# Patient Record
Sex: Female | Born: 1995 | Race: Black or African American | Hispanic: No | Marital: Single | Smoking: Never smoker
Health system: Southern US, Community
[De-identification: ages and names within clinical notes are randomized; demographics above are authoritative.]

## PROBLEM LIST (undated history)

## (undated) DIAGNOSIS — J45909 Unspecified asthma, uncomplicated: Secondary | ICD-10-CM

## (undated) HISTORY — PX: TONSILLECTOMY: SHX5217

---

## 2022-02-25 ENCOUNTER — Encounter: Payer: Self-pay | Admitting: *Deleted

## 2022-02-25 ENCOUNTER — Other Ambulatory Visit: Payer: Self-pay

## 2022-02-25 ENCOUNTER — Emergency Department: Payer: Medicaid Other

## 2022-02-25 ENCOUNTER — Inpatient Hospital Stay
Admission: EM | Admit: 2022-02-25 | Discharge: 2022-02-28 | DRG: 832 | Disposition: A | Discharge status: Home / Self Care | Payer: Medicaid Other | Attending: Internal Medicine | Admitting: Internal Medicine

## 2022-02-25 ENCOUNTER — Observation Stay
Admission: EM | Admit: 2022-02-25 | Discharge: 2022-02-25 | Disposition: A | Discharge status: Home / Self Care | Payer: Medicaid Other | Attending: Internal Medicine | Admitting: Internal Medicine

## 2022-02-25 DIAGNOSIS — K920 Hematemesis: Secondary | ICD-10-CM | POA: Diagnosis present

## 2022-02-25 DIAGNOSIS — O26891 Other specified pregnancy related conditions, first trimester: Secondary | ICD-10-CM | POA: Diagnosis present

## 2022-02-25 DIAGNOSIS — Z3A01 Less than 8 weeks gestation of pregnancy: Secondary | ICD-10-CM | POA: Insufficient documentation

## 2022-02-25 DIAGNOSIS — O210 Mild hyperemesis gravidarum: Principal | ICD-10-CM | POA: Diagnosis present

## 2022-02-25 DIAGNOSIS — J45909 Unspecified asthma, uncomplicated: Secondary | ICD-10-CM | POA: Diagnosis present

## 2022-02-25 DIAGNOSIS — E876 Hypokalemia: Secondary | ICD-10-CM | POA: Diagnosis not present

## 2022-02-25 DIAGNOSIS — O211 Hyperemesis gravidarum with metabolic disturbance: Secondary | ICD-10-CM | POA: Diagnosis present

## 2022-02-25 DIAGNOSIS — O3482 Maternal care for other abnormalities of pelvic organs, second trimester: Secondary | ICD-10-CM | POA: Diagnosis present

## 2022-02-25 DIAGNOSIS — O99511 Diseases of the respiratory system complicating pregnancy, first trimester: Secondary | ICD-10-CM | POA: Diagnosis not present

## 2022-02-25 DIAGNOSIS — O0281 Inappropriate change in quantitative human chorionic gonadotropin (hCG) in early pregnancy: Secondary | ICD-10-CM | POA: Diagnosis not present

## 2022-02-25 DIAGNOSIS — O99321 Drug use complicating pregnancy, first trimester: Secondary | ICD-10-CM | POA: Diagnosis present

## 2022-02-25 DIAGNOSIS — O99611 Diseases of the digestive system complicating pregnancy, first trimester: Secondary | ICD-10-CM | POA: Diagnosis not present

## 2022-02-25 DIAGNOSIS — K760 Fatty (change of) liver, not elsewhere classified: Secondary | ICD-10-CM | POA: Diagnosis present

## 2022-02-25 DIAGNOSIS — K922 Gastrointestinal hemorrhage, unspecified: Secondary | ICD-10-CM | POA: Diagnosis not present

## 2022-02-25 DIAGNOSIS — E039 Hypothyroidism, unspecified: Secondary | ICD-10-CM | POA: Insufficient documentation

## 2022-02-25 DIAGNOSIS — R112 Nausea with vomiting, unspecified: Secondary | ICD-10-CM

## 2022-02-25 DIAGNOSIS — N83201 Unspecified ovarian cyst, right side: Secondary | ICD-10-CM | POA: Diagnosis present

## 2022-02-25 DIAGNOSIS — O99281 Endocrine, nutritional and metabolic diseases complicating pregnancy, first trimester: Secondary | ICD-10-CM | POA: Diagnosis not present

## 2022-02-25 DIAGNOSIS — F1210 Cannabis abuse, uncomplicated: Secondary | ICD-10-CM | POA: Diagnosis present

## 2022-02-25 DIAGNOSIS — R1031 Right lower quadrant pain: Secondary | ICD-10-CM | POA: Diagnosis present

## 2022-02-25 HISTORY — DX: Unspecified asthma, uncomplicated: J45.909

## 2022-02-25 LAB — HEMOGLOBIN AND HEMATOCRIT, BLOOD
HCT: 31.9 % — ABNORMAL LOW (ref 36.0–46.0)
Hemoglobin: 10.9 g/dL — ABNORMAL LOW (ref 12.0–15.0)

## 2022-02-25 LAB — CBC WITH DIFFERENTIAL/PLATELET
Abs Immature Granulocytes: 0.04 10*3/uL (ref 0.00–0.07)
Basophils Absolute: 0 10*3/uL (ref 0.0–0.1)
Basophils Relative: 0 %
Eosinophils Absolute: 0 10*3/uL (ref 0.0–0.5)
Eosinophils Relative: 0 %
HCT: 31.1 % — ABNORMAL LOW (ref 36.0–46.0)
Hemoglobin: 10.7 g/dL — ABNORMAL LOW (ref 12.0–15.0)
Immature Granulocytes: 0 %
Lymphocytes Relative: 19 %
Lymphs Abs: 2.1 10*3/uL (ref 0.7–4.0)
MCH: 30.7 pg (ref 26.0–34.0)
MCHC: 34.4 g/dL (ref 30.0–36.0)
MCV: 89.4 fL (ref 80.0–100.0)
Monocytes Absolute: 0.9 10*3/uL (ref 0.1–1.0)
Monocytes Relative: 8 %
Neutro Abs: 8 10*3/uL — ABNORMAL HIGH (ref 1.7–7.7)
Neutrophils Relative %: 73 %
Platelets: 251 10*3/uL (ref 150–400)
RBC: 3.48 MIL/uL — ABNORMAL LOW (ref 3.87–5.11)
RDW: 12 % (ref 11.5–15.5)
WBC: 11.1 10*3/uL — ABNORMAL HIGH (ref 4.0–10.5)
nRBC: 0 % (ref 0.0–0.2)

## 2022-02-25 LAB — COMPREHENSIVE METABOLIC PANEL
ALT: 62 U/L — ABNORMAL HIGH (ref 0–44)
AST: 65 U/L — ABNORMAL HIGH (ref 15–41)
Albumin: 4.4 g/dL (ref 3.5–5.0)
Alkaline Phosphatase: 34 U/L — ABNORMAL LOW (ref 38–126)
Anion gap: 8 (ref 5–15)
BUN: 9 mg/dL (ref 6–20)
CO2: 24 mmol/L (ref 22–32)
Calcium: 9.1 mg/dL (ref 8.9–10.3)
Chloride: 104 mmol/L (ref 98–111)
Creatinine, Ser: 0.54 mg/dL (ref 0.44–1.00)
GFR, Estimated: >60 mL/min (ref >60)
Glucose, Bld: 98 mg/dL (ref 70–99)
Potassium: 3.2 mmol/L — ABNORMAL LOW (ref 3.5–5.1)
Sodium: 136 mmol/L (ref 135–145)
Total Bilirubin: 1.1 mg/dL (ref 0.3–1.2)
Total Protein: 7.1 g/dL (ref 6.5–8.1)

## 2022-02-25 LAB — CBC
HCT: 35.2 % — ABNORMAL LOW (ref 36.0–46.0)
Hemoglobin: 12 g/dL (ref 12.0–15.0)
MCH: 30.8 pg (ref 26.0–34.0)
MCHC: 34.1 g/dL (ref 30.0–36.0)
MCV: 90.3 fL (ref 80.0–100.0)
Platelets: 298 10*3/uL (ref 150–400)
RBC: 3.9 MIL/uL (ref 3.87–5.11)
RDW: 11.9 % (ref 11.5–15.5)
WBC: 12.1 10*3/uL — ABNORMAL HIGH (ref 4.0–10.5)
nRBC: 0 % (ref 0.0–0.2)

## 2022-02-25 LAB — LIPASE, BLOOD: Lipase: 26 U/L (ref 11–51)

## 2022-02-25 LAB — HIV ANTIBODY (ROUTINE TESTING W REFLEX): HIV Screen 4th Generation wRfx: NONREACTIVE

## 2022-02-25 LAB — HCG, QUANTITATIVE, PREGNANCY: hCG, Beta Chain, Quant, S: 88612 m[IU]/mL — ABNORMAL HIGH (ref <5)

## 2022-02-25 IMAGING — US US ABDOMEN LIMITED
1 series · 14 of 25 positions shown · non-contrast
Comparison: None.

CLINICAL DATA: Hepatic transaminitis.

EXAM:
ULTRASOUND ABDOMEN LIMITED RIGHT UPPER QUADRANT

[Series 1: us abdomen limited ruq (liver/gb) · 14 of 39 slices shown]
[im 1/39]
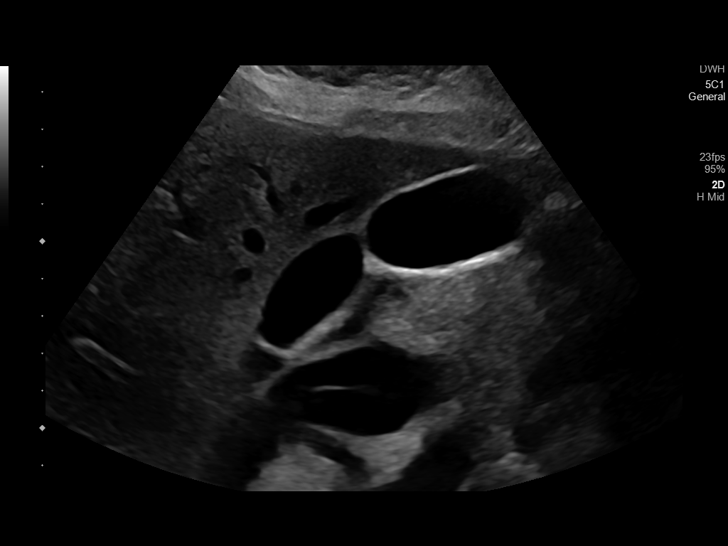
[im 4/39]
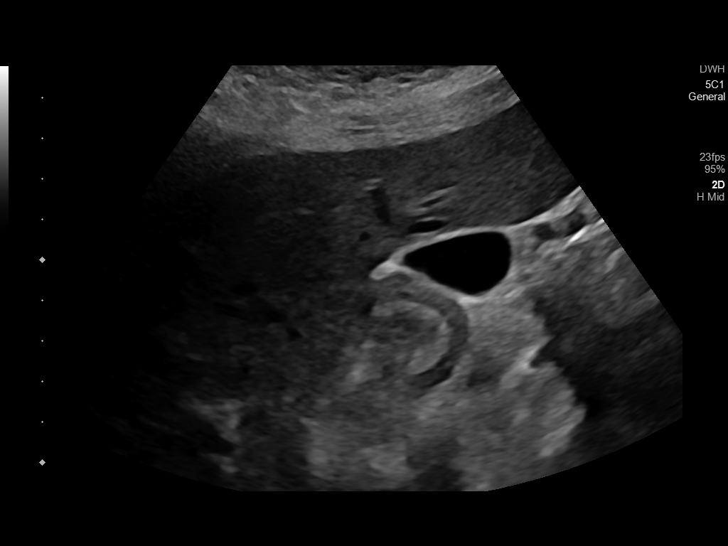
[im 7/39]
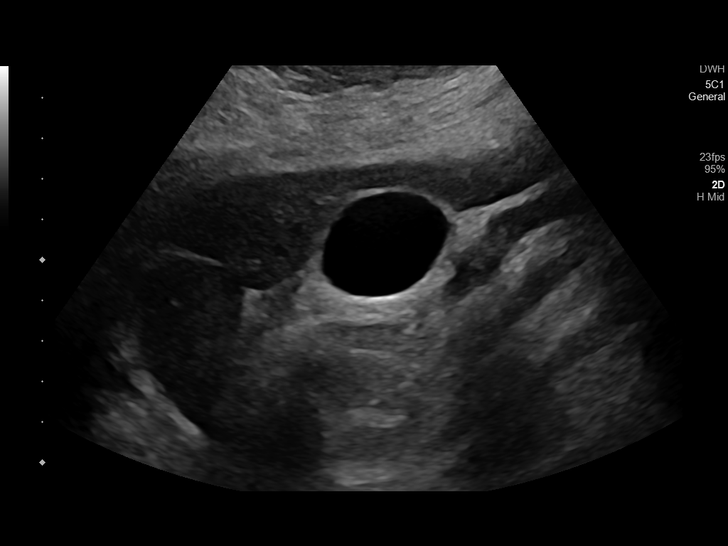
[im 10/39]
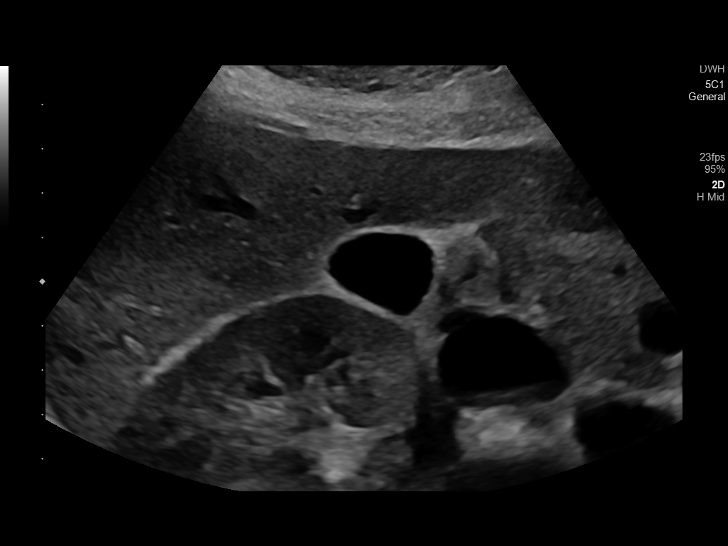
[im 13/39]
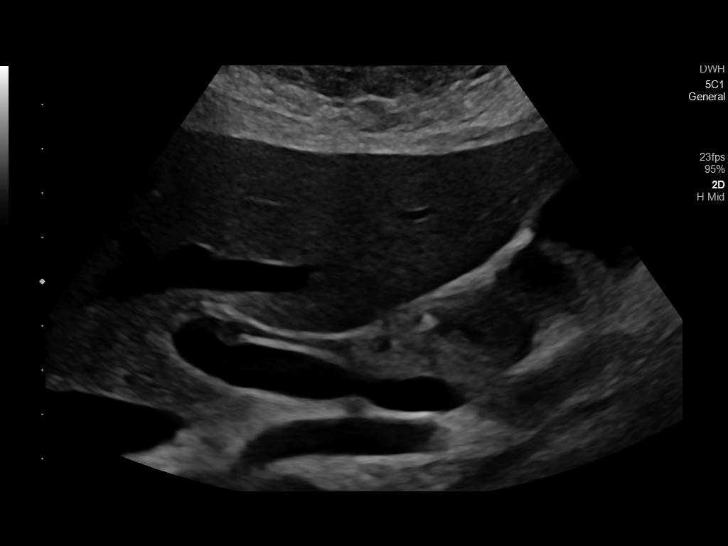
[im 15/39]
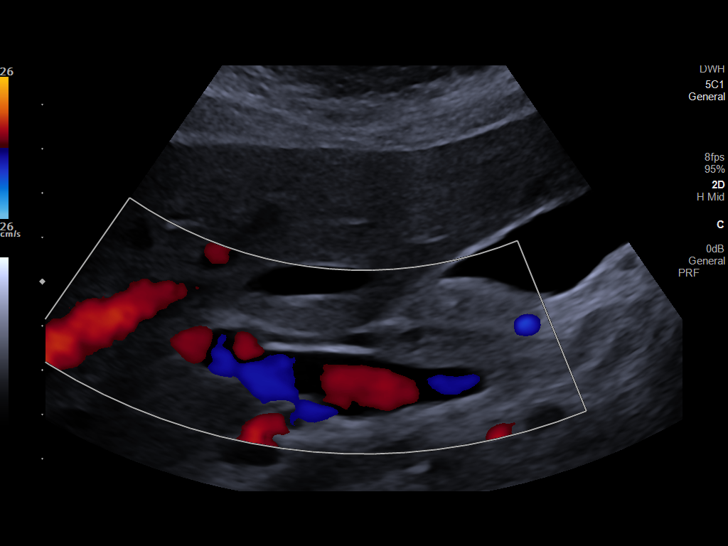
[im 18/39]
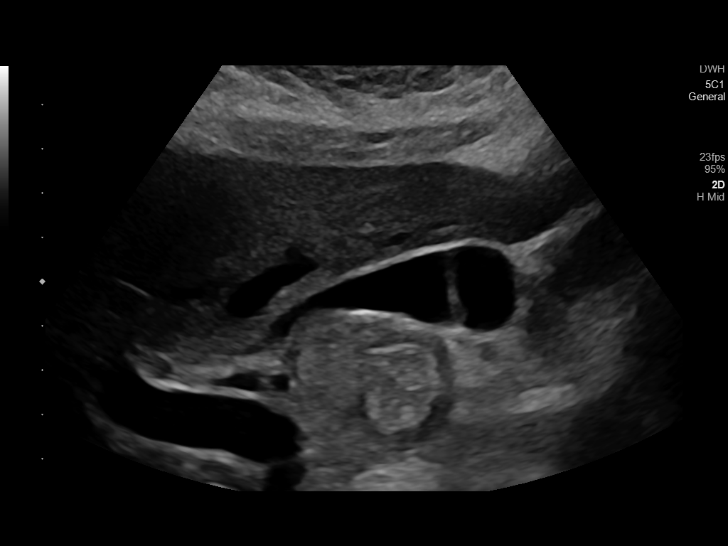
[im 21/39]
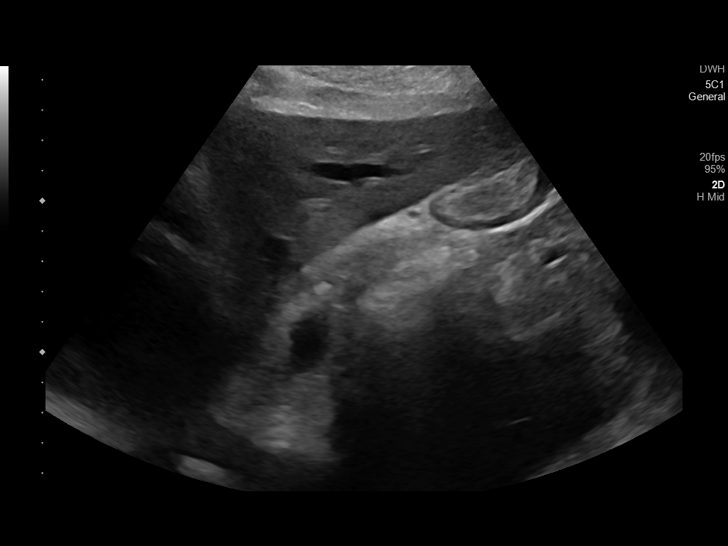
[im 24/39]
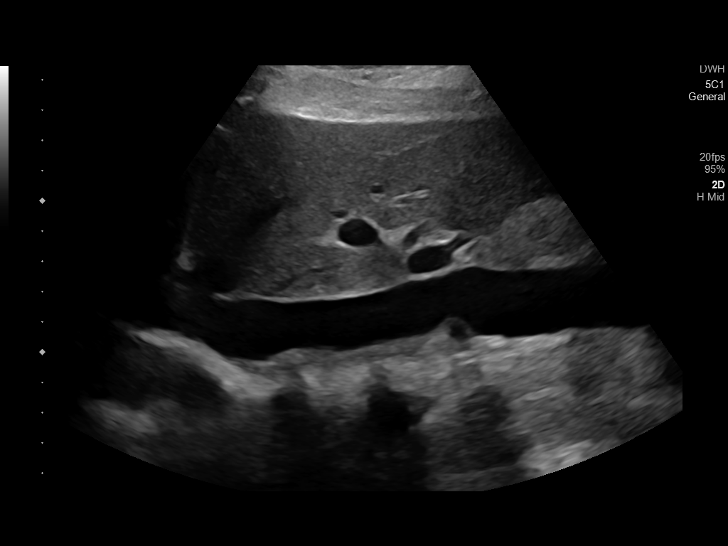
[im 26/39]
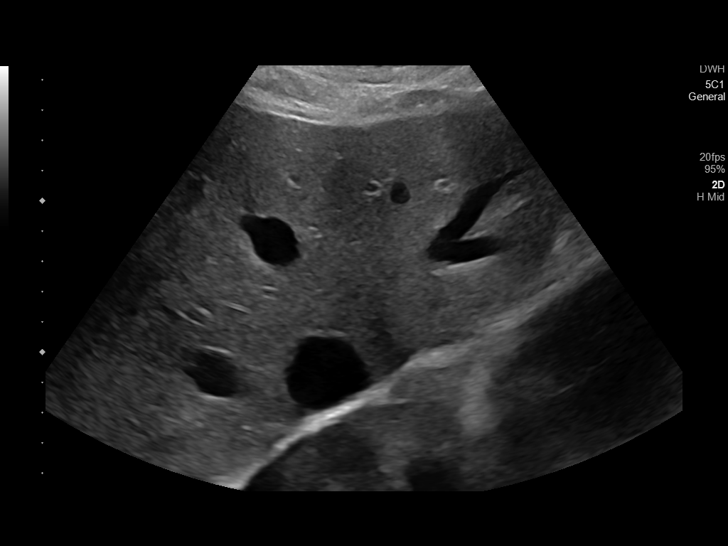
[im 29/39]
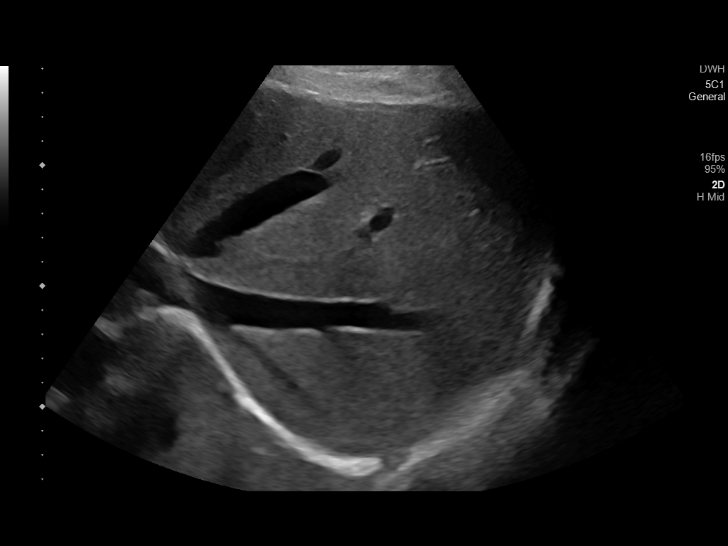
[im 32/39]
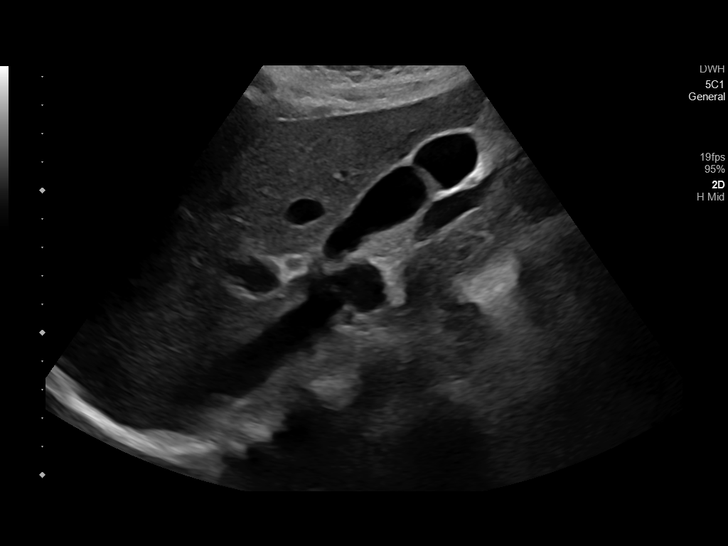
[im 35/39]
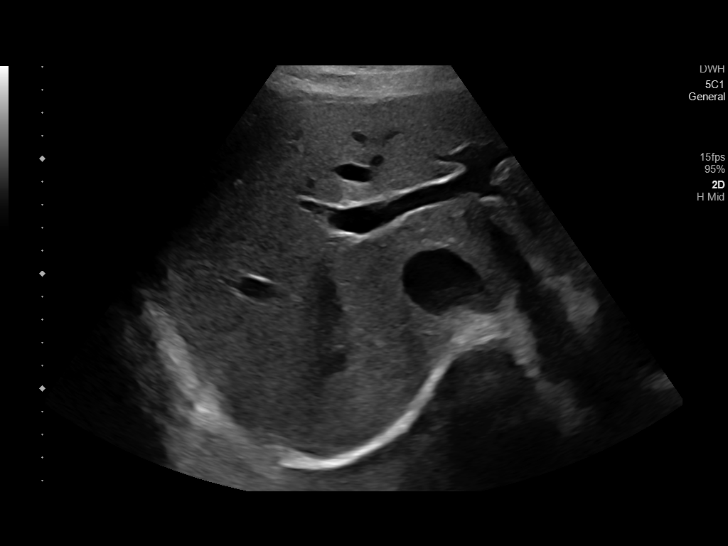
[im 39/39]
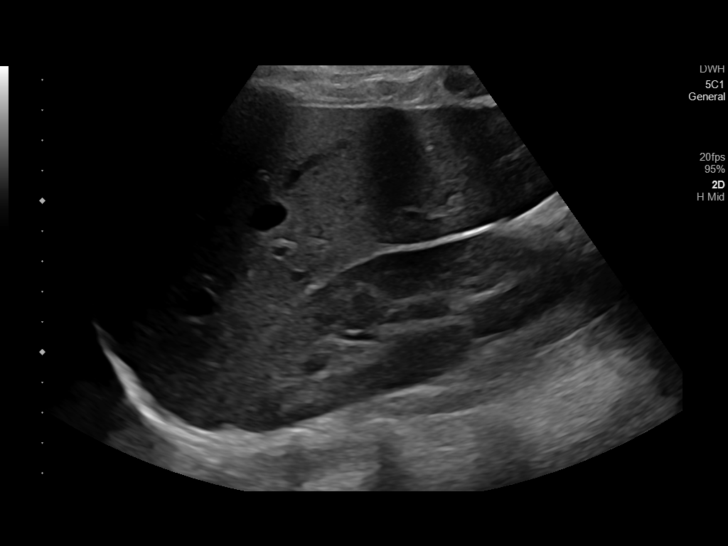

[14 of 25 positions shown; findings below may reference images not displayed]

FINDINGS: Gallbladder:

No gallstones or wall thickening visualized. No sonographic Murphy
sign noted by sonographer.

Common bile duct:

Diameter: 2.7 mm with no appreciable intrahepatic biliary
dilatation.

Liver:

No focal lesion identified. There is mild generalized increased
hepatic echogenicity of steatosis. Portal vein is patent on color
Doppler imaging with normal direction of blood flow towards the
liver.

Other: None.
IMPRESSION: Increased hepatic echogenicity consistent with steatosis. Otherwise
negative ultrasound.

## 2022-02-25 IMAGING — US US OB < 14 WEEKS - US OB TV
1 series · 14 of 28 positions shown · non-contrast
Comparison: None.

CLINICAL DATA: Vomiting for 4 days

EXAM:
OBSTETRIC <14 WK US AND TRANSVAGINAL OB US
TECHNIQUE: Both transabdominal and transvaginal ultrasound examinations were
performed for complete evaluation of the gestation as well as the
maternal uterus, adnexal regions, and pelvic cul-de-sac.
Transvaginal technique was performed to assess early pregnancy.

[Series 1: us ob less than 14 weeks with ob transvaginal · 14 of 131 slices shown]
[im 5/131]
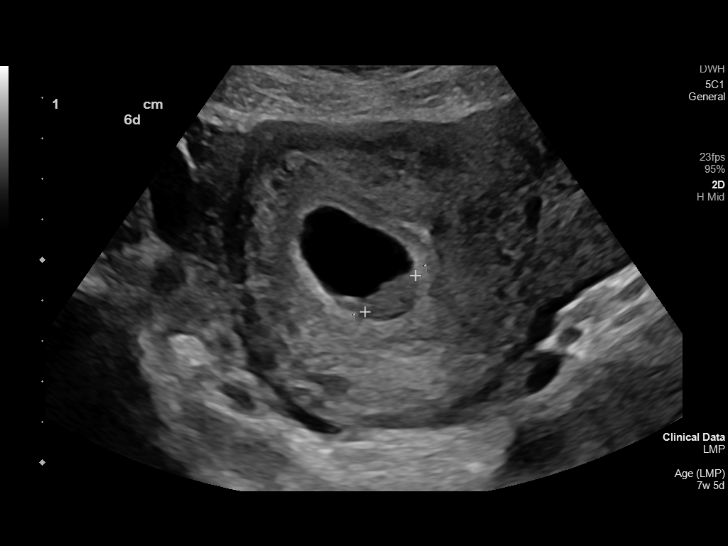
[im 15/131]
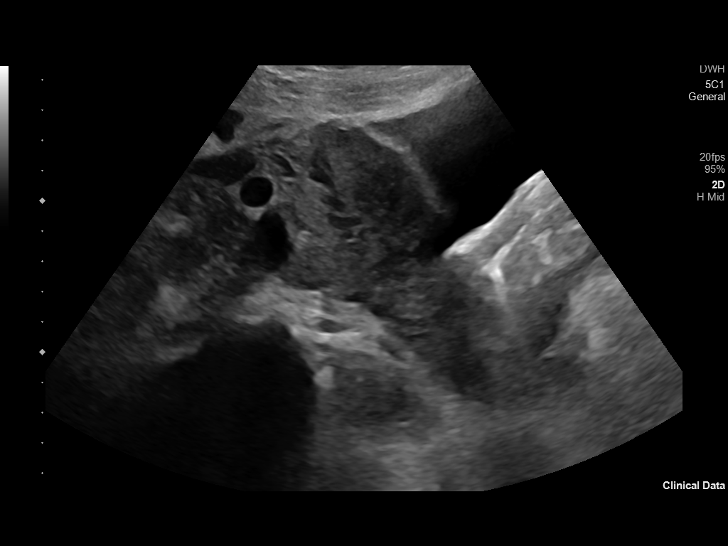
[im 25/131]
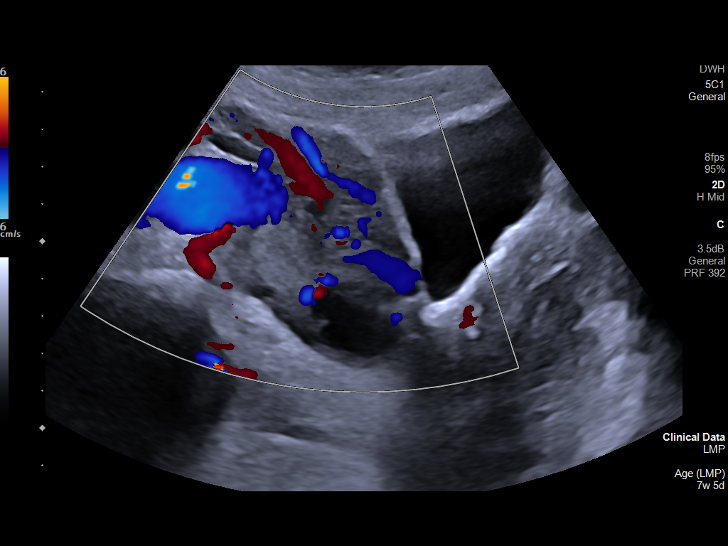
[im 34/131]
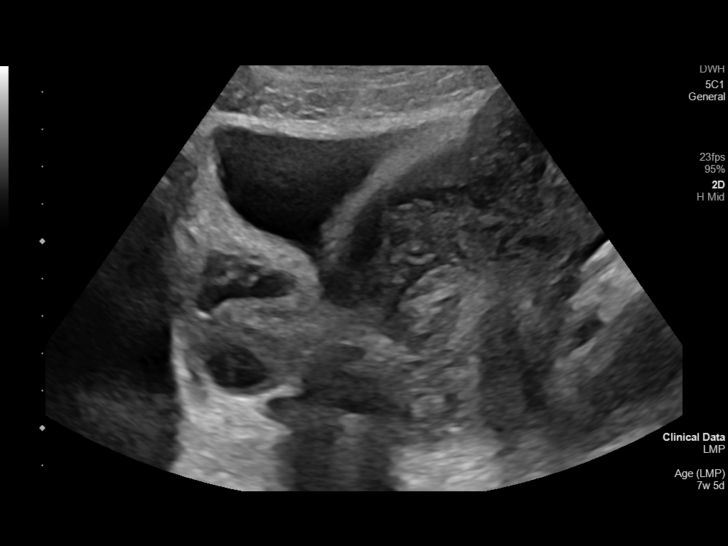
[im 44/131]
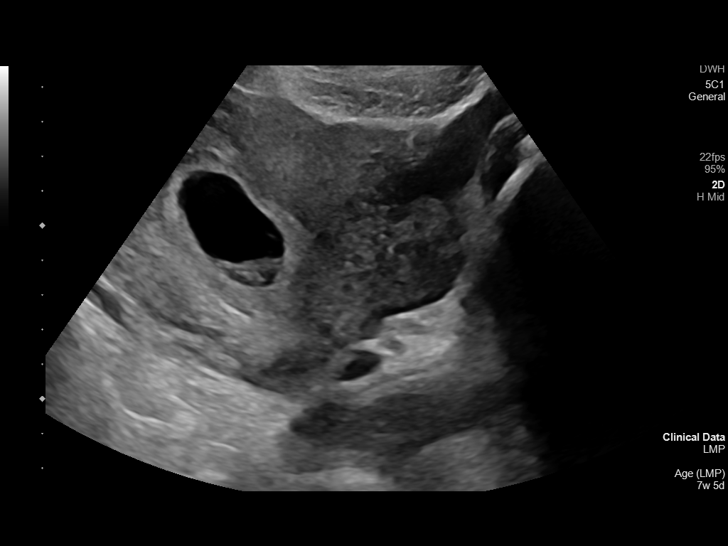
[im 53/131]
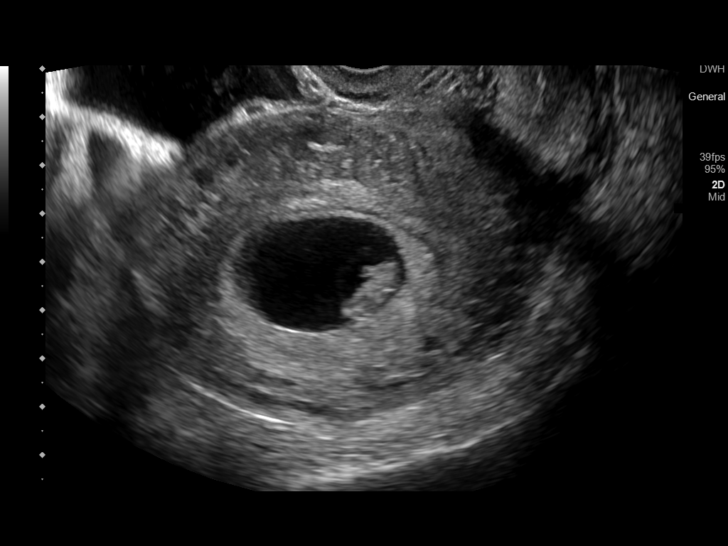
[im 63/131]
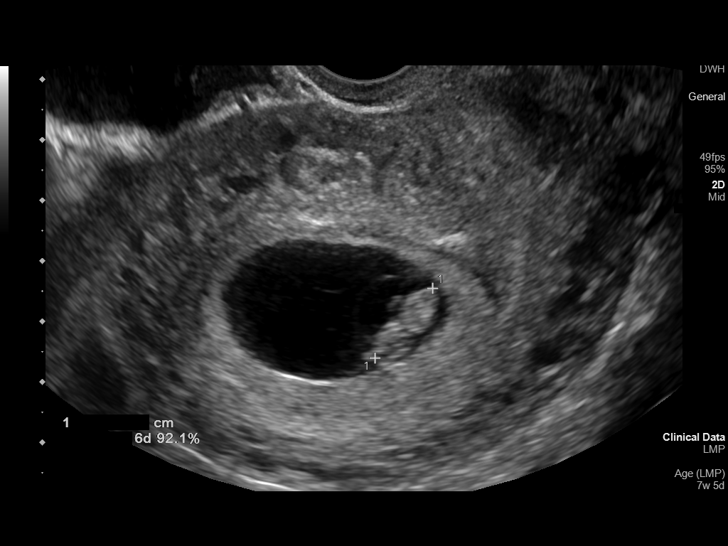
[im 73/131]
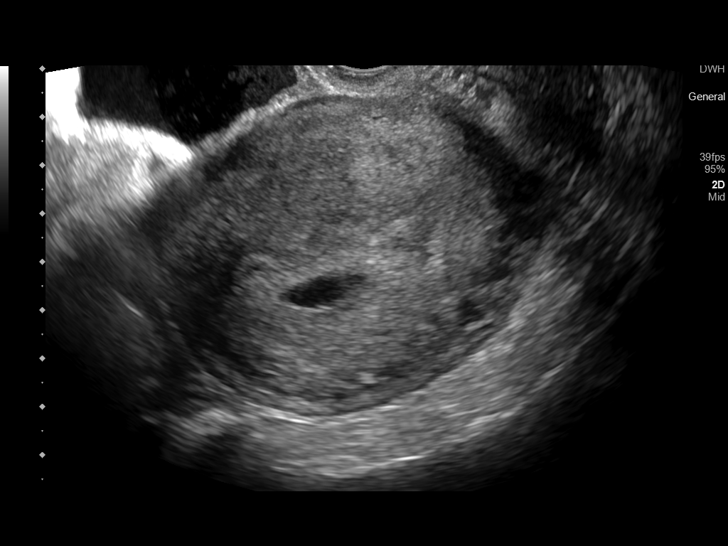
[im 82/131]
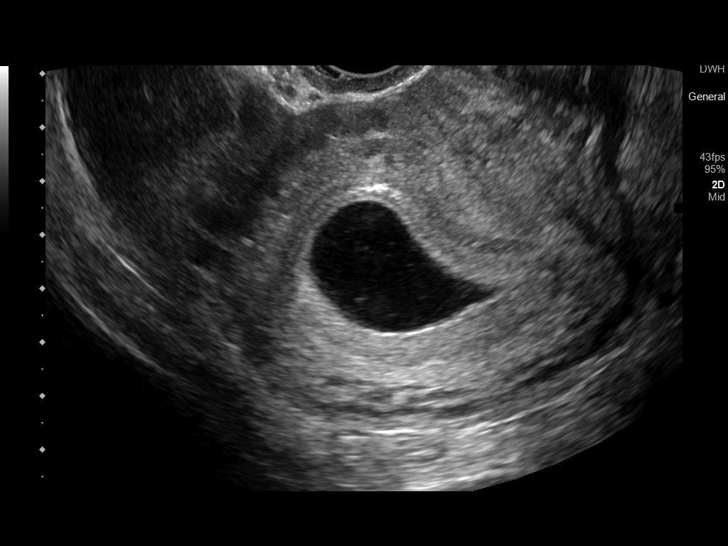
[im 92/131]
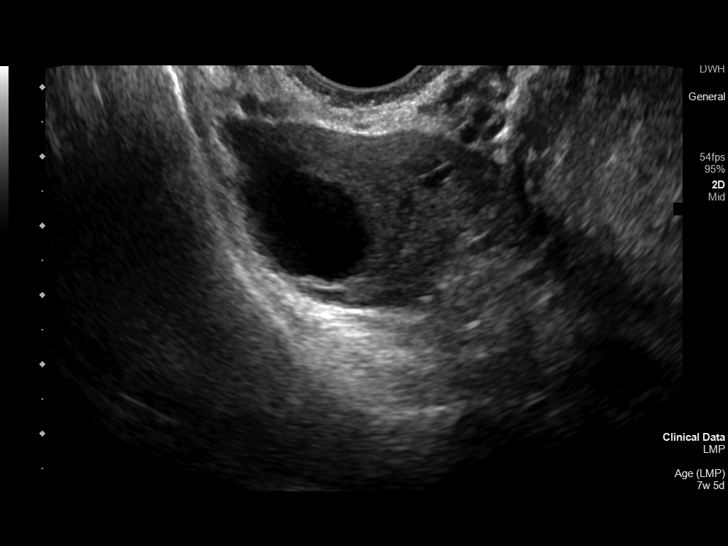
[im 102/131]
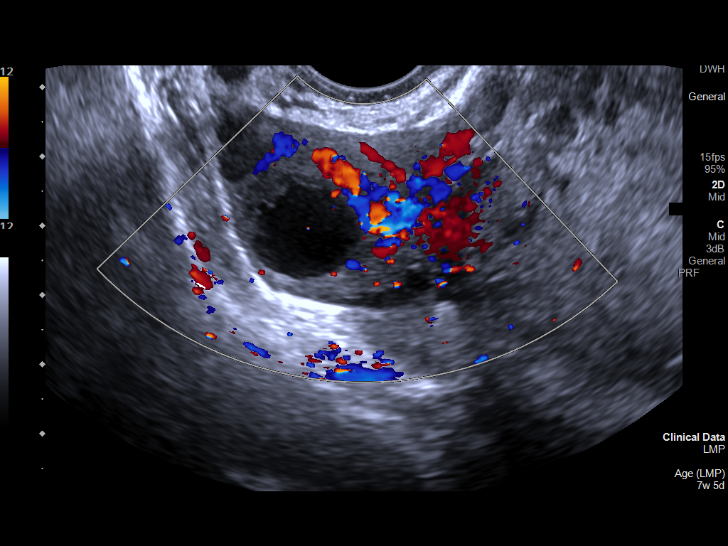
[im 111/131]
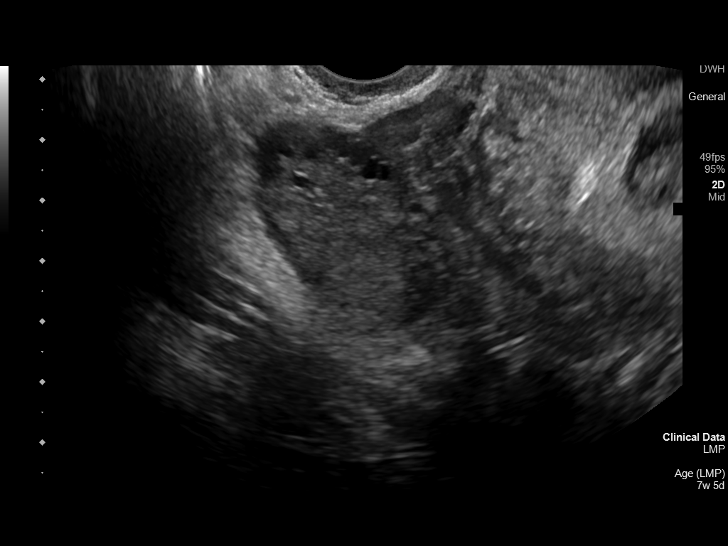
[im 121/131]
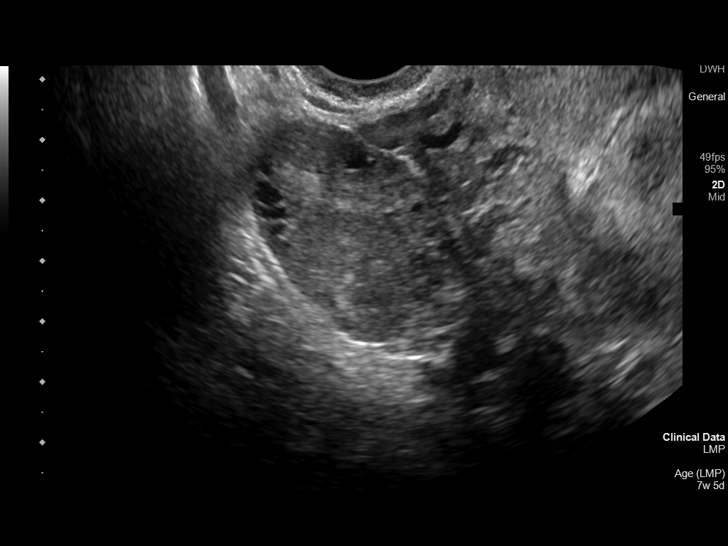
[im 131/131]
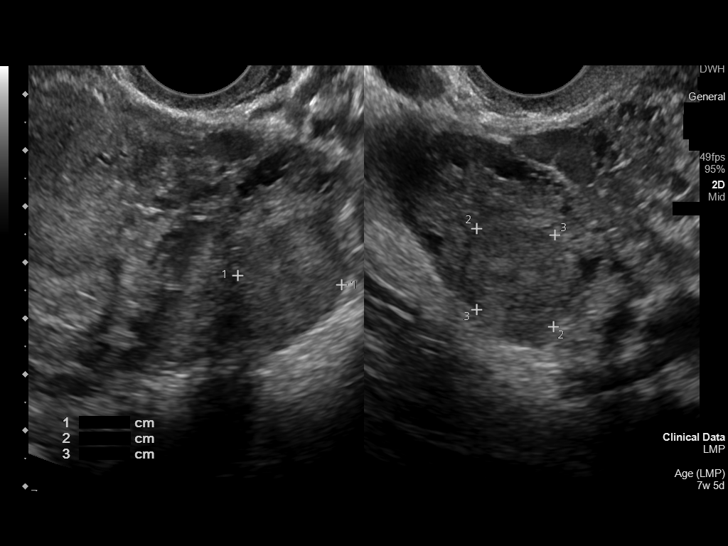

[14 of 28 positions shown; findings below may reference images not displayed]

FINDINGS: Intrauterine gestational sac: Single

Yolk sac:  Visualized.

Embryo:  Visualized.

Cardiac Activity: Visualized.

Heart Rate: 170 bpm

CRL:  17 mm   7 w   6 d                  US EDC: [DATE]

Subchorionic hemorrhage:  None visualized

Maternal uterus/adnexae: Corpus luteum on the left. 2.7 cm primarily
cyst with thin septation in the right ovary, likely previously
hemorrhagic follicle.
IMPRESSION: 1. Single living intrauterine pregnancy measuring 7 weeks 6 days.
2. Thinly septated 2.7 cm right ovarian cyst, likely hemorrhagic
follicle. Attention on follow-up US.

## 2022-02-25 MED ORDER — METOCLOPRAMIDE HCL 5 MG/ML IJ SOLN
5.0000 mg | Freq: Four times a day (QID) | INTRAMUSCULAR | Status: DC | PRN
  Administered 2022-02-25 (×2): 5 mg via INTRAVENOUS
  Filled 2022-02-25 (×2): qty 2

## 2022-02-25 MED ORDER — LOPERAMIDE HCL 2 MG PO CAPS
2.0000 mg | ORAL_CAPSULE | ORAL | Status: DC | PRN
  Filled 2022-02-25: qty 1

## 2022-02-25 MED ORDER — PANTOPRAZOLE SODIUM 40 MG IV SOLR
40.0000 mg | Freq: Two times a day (BID) | INTRAVENOUS | Status: DC
Start: 2022-02-25 — End: 2022-02-25
  Administered 2022-02-25: 40 mg via INTRAVENOUS
  Filled 2022-02-25 (×2): qty 10

## 2022-02-25 MED ORDER — PROMETHAZINE HCL 25 MG/ML IJ SOLN
25.0000 mg | Freq: Once | INTRAMUSCULAR | Status: AC
  Administered 2022-02-26: 25 mg via INTRAVENOUS
  Filled 2022-02-25: qty 1

## 2022-02-25 MED ORDER — POTASSIUM CHLORIDE IN NACL 20-0.9 MEQ/L-% IV SOLN
INTRAVENOUS | Status: DC
  Filled 2022-02-25 (×3): qty 1000

## 2022-02-25 MED ORDER — DOXYLAMINE SUCCINATE (SLEEP) 25 MG PO TABS: 25.0000 mg | ORAL_TABLET | Freq: Three times a day (TID) | ORAL | Status: DC

## 2022-02-25 MED ORDER — PROMETHAZINE HCL 25 MG RE SUPP: 25.0000 mg | Freq: Three times a day (TID) | RECTAL | 0 refills | Status: DC | PRN

## 2022-02-25 MED ORDER — ONDANSETRON HCL 4 MG/2ML IJ SOLN
4.0000 mg | Freq: Once | INTRAMUSCULAR | Status: AC
  Administered 2022-02-25: 4 mg via INTRAVENOUS
  Filled 2022-02-25: qty 2

## 2022-02-25 MED ORDER — DOXYLAMINE SUCCINATE (SLEEP) 25 MG PO TABS
25.0000 mg | ORAL_TABLET | Freq: Two times a day (BID) | ORAL | Status: DC
  Administered 2022-02-25: 25 mg via ORAL
  Filled 2022-02-25: qty 1

## 2022-02-25 MED ORDER — LACTATED RINGERS IV SOLN: INTRAVENOUS | Status: DC

## 2022-02-25 MED ORDER — MAGNESIUM HYDROXIDE 400 MG/5ML PO SUSP: 30.0000 mL | Freq: Every day | ORAL | Status: DC | PRN

## 2022-02-25 MED ORDER — ENOXAPARIN SODIUM 40 MG/0.4ML IJ SOSY
40.0000 mg | PREFILLED_SYRINGE | INTRAMUSCULAR | Status: DC
  Administered 2022-02-25: 40 mg via SUBCUTANEOUS
  Filled 2022-02-25: qty 0.4

## 2022-02-25 MED ORDER — PROMETHAZINE HCL 25 MG RE SUPP
25.0000 mg | Freq: Three times a day (TID) | RECTAL | Status: DC | PRN
  Filled 2022-02-25: qty 1

## 2022-02-25 MED ORDER — PROCHLORPERAZINE EDISYLATE 10 MG/2ML IJ SOLN
10.0000 mg | Freq: Three times a day (TID) | INTRAMUSCULAR | Status: DC | PRN
  Administered 2022-02-25: 10 mg via INTRAVENOUS
  Filled 2022-02-25 (×2): qty 2

## 2022-02-25 MED ORDER — DEXTROSE-NACL 5-0.45 % IV SOLN
Freq: Once | INTRAVENOUS | Status: AC
Start: 2022-02-25 — End: 2022-02-26

## 2022-02-25 MED ORDER — FAMOTIDINE IN NACL 20-0.9 MG/50ML-% IV SOLN
20.0000 mg | Freq: Once | INTRAVENOUS | Status: AC
  Administered 2022-02-26: 20 mg via INTRAVENOUS
  Filled 2022-02-25: qty 50

## 2022-02-25 MED ORDER — METOCLOPRAMIDE HCL 5 MG/ML IJ SOLN
10.0000 mg | Freq: Once | INTRAMUSCULAR | Status: AC
  Administered 2022-02-25: 10 mg via INTRAVENOUS
  Filled 2022-02-25: qty 2

## 2022-02-25 MED ORDER — DEXTROSE IN LACTATED RINGERS 5 % IV SOLN: INTRAVENOUS | Status: DC

## 2022-02-25 MED ORDER — ACETAMINOPHEN 325 MG PO TABS
650.0000 mg | ORAL_TABLET | Freq: Four times a day (QID) | ORAL | Status: DC | PRN
  Administered 2022-02-25: 650 mg via ORAL
  Filled 2022-02-25: qty 2

## 2022-02-25 MED ORDER — ACETAMINOPHEN 650 MG RE SUPP: 650.0000 mg | Freq: Four times a day (QID) | RECTAL | Status: DC | PRN

## 2022-02-25 NOTE — Progress Notes (Signed)
Verbal report given to [place].  ?

## 2022-02-25 NOTE — Discharge Summary (Signed)
?Physician Discharge Summary ?  ?Patient: Debra Murray MRN: [id number] DOB: [birth year]  ?Admit date:     02/25/2022  ?Discharge date: 02/25/22  ?Discharge Physician: Enedina Finner  ? ?PCP: Pcp, No  ? ?Recommendations at discharge:  ? ? Pt advised to cont with liquid diet for now and advance as tolerated. She is advised to return to ER if s/s worsen. ? ?FOLLOW up [facility] on your appt or in 2-3 days ? ?Discharge Diagnoses: ? ?Hyperemesis Gravidarum ? ?Hospital Course: ? ?Debra Murray is a 26 y.o. female who is [redacted] weeks pregnant with medical history significant for hyperemesis gravidarum, who presented to the ER with acute onset of intractable nausea and vomiting for the last 24 hours.  She was just hospitalized over 24 hours in [facility] for similar symptoms and thought she was getting better however her symptoms recurred after discharge.  Despite IV Reglan and Zofran in the ER she continued to have nausea and vomiting.  She denied any abdominal pain ? ?Imaging: Right upper quad ultrasound revealed hepatic steatosis and was otherwise negative.  OB ultrasound revealed a single living IUP measuring 7 weeks and 6 days and thinly septated 2.7 cm right ovarian cyst likely hemorrhagic follicle.  ? ?Hyperemesis gravidarum ?- pt recieved IV hydration with IV normal saline with added potassium chloride. ?- Antiemetics prn ?- PPI therapy  ?--pt did not have any hemetemesis during her stay in the hospital. ?--tolerated some CLD ?--seen by Dr Feliberto Gottron -- agrees with above  ?--pt requested to RN to go home. Worried about her small children. I spoke with her and she expressed the same wish to be discharged. Rxed Phenergan suppository ? ?--advised to return to [facility] ER or [facility] ER if symptoms worsen. She voiced understanding. ?  ?Hypokalemia ?- received IVF with KCL ? ?  ? ?. ?Consultants: Ob-GYN ?Disposition: Home ?Diet recommendation:  ?Discharge Diet Orders (From admission, onward)  ? ?  Start     Ordered  ? 02/25/22 0000  Diet  full liquid       ? 02/25/22 1744  ? 02/25/22 0000  Diet clear liquid       ? 02/25/22 1744  ? ?  ?  ? ?  ? ?Full liquid diet ?DISCHARGE MEDICATION: ?Allergies as of 02/25/2022   ?No Known Allergies ?  ? ?  ?Medication List  ?  ? ?TAKE these medications   ? ?ondansetron 4 MG disintegrating tablet ?Commonly known as: ZOFRAN-ODT ?Take by mouth. ?  ?promethazine 25 MG tablet ?Commonly known as: PHENERGAN ?Take 25 mg by mouth every 6 (six) hours as needed. ?What changed: Another medication with the same name was added. Make sure you understand how and when to take each. ?  ?promethazine 25 MG suppository ?Commonly known as: PHENERGAN ?Place 1 suppository (25 mg total) rectally every 8 (eight) hours as needed for nausea or vomiting. ?What changed: You were already taking a medication with the same name, and this prescription was added. Make sure you understand how and when to take each. ?  ? ?  ? ? ?Discharge Exam: ?Filed Weights  ? 02/25/22 0111  ?Weight: 77.6 kg  ? ? ? ?Condition at discharge: fair ? ?The results of significant diagnostics from this hospitalization (including imaging, microbiology, ancillary and laboratory) are listed below for reference.  ? ?Imaging Studies: ?US OB LESS THAN 14 WEEKS WITH OB TRANSVAGINAL ? ?Result Date: 02/25/2022 ?CLINICAL DATA:  Vomiting for 4 days EXAM: OBSTETRIC <14 WK [place] AND TRANSVAGINAL OB US  TECHNIQUE: Both transabdominal and transvaginal ultrasound examinations were performed for complete evaluation of the gestation as well as the maternal uterus, adnexal regions, and pelvic cul-de-sac. Transvaginal technique was performed to assess early pregnancy. COMPARISON:  None. FINDINGS: Intrauterine gestational sac: Single Yolk sac:  Visualized. Embryo:  Visualized. Cardiac Activity: Visualized. Heart Rate: 170 bpm CRL:  17 mm   7 w   6 d                  [place] EDC: 10/08/2022 Subchorionic hemorrhage:  None visualized Maternal uterus/adnexae: Corpus luteum on the left. 2.7 cm primarily cyst  with thin septation in the right ovary, likely previously hemorrhagic follicle. IMPRESSION: 1. Single living intrauterine pregnancy measuring 7 weeks 6 days. 2. Thinly septated 2.7 cm right ovarian cyst, likely hemorrhagic follicle. Attention on follow-up US. Electronically Signed   By: Tiburcio Pea M.D.   On: 02/25/2022 05:10  ? ?US ABDOMEN LIMITED RUQ (LIVER/GB) ? ?Result Date: 02/25/2022 ?CLINICAL DATA:  Hepatic transaminitis. EXAM: ULTRASOUND ABDOMEN LIMITED RIGHT UPPER QUADRANT COMPARISON:  None. FINDINGS: Gallbladder: No gallstones or wall thickening visualized. No sonographic Murphy sign noted by sonographer. Common bile duct: Diameter: 2.7 mm with no appreciable intrahepatic biliary dilatation. Liver: No focal lesion identified. There is mild generalized increased hepatic echogenicity of steatosis. Portal vein is patent on color Doppler imaging with normal direction of blood flow towards the liver. Other: None. IMPRESSION: Increased hepatic echogenicity consistent with steatosis. Otherwise negative ultrasound. Electronically Signed   By: Almira Bar M.D.   On: 02/25/2022 05:10   ? ?Microbiology: ?No results found for this or any previous visit. ? ?Labs: ?CBC: ?Recent Labs  ?Lab 02/25/22 ?0114 02/25/22 ?6789  ?WBC 12.1*  --   ?HGB 12.0 10.9*  ?HCT 35.2* 31.9*  ?MCV 90.3  --   ?PLT 298  --   ? ?Basic Metabolic Panel: ?Recent Labs  ?Lab 02/25/22 ?0114  ?NA 136  ?K 3.2*  ?CL 104  ?CO2 24  ?GLUCOSE 98  ?BUN 9  ?CREATININE 0.54  ?CALCIUM 9.1  ? ?Liver Function Tests: ?Recent Labs  ?Lab 02/25/22 ?0114  ?AST 65*  ?ALT 62*  ?ALKPHOS 34*  ?BILITOT 1.1  ?PROT 7.1  ?ALBUMIN 4.4  ? ?CBG: ?No results for input(s): GLUCAP in the last 168 hours. ? ?Discharge time spent: less than 30 minutes. ? ?Signed: ?Enedina Finner, MD ?[facility] ?02/25/2022 ?

## 2022-02-25 NOTE — ED Provider Notes (Signed)
? ?[facility] ?Provider Note ? ? ? Event Date/Time  ? First MD Initiated Contact with Patient 02/25/22 0144   ?  (approximate) ? ? ?History  ? ?Nausea ? ? ?HPI ? ?Debra Murray is a 26 y.o. female with a history of hyperemesis gravidarum in the past, current pregnancy with approximate gestational age of [redacted] weeks per LMP, hypothyroidism who presents for evaluation of nausea and vomiting.  Patient reports 4 days of intractable nausea and vomiting.  Was [facility] but left AMA this morning.  Patient unable to tolerate p.o. at home therefore return to the ER for evaluation.  Patient reports pretty severe hyperemesis gravidarum with past pregnancy requiring multiple hospitalizations and weekly IV fluid administration.  She has been taking Phenergan at home without significant relief.  She denies vaginal bleeding, vaginal discharge, abdominal pain, fever or chills.  Has had no constipation or diarrhea. ?  ?PMH ?Hypothyroidism ?Hyperemesis gravidarum ?Asthma ? ?Physical Exam  ? ?Triage Vital Signs: ?ED Triage Vitals [02/25/22 0111]  ?Enc Vitals Group  ?   BP 130/84  ?   Pulse Rate (!) 56  ?   Resp 20  ?   Temp 98 ?F (36.7 ?C)  ?   Temp Source Oral  ?   SpO2 98 %  ?   Weight 171 lb (77.6 kg)  ?   Height 6\' 1"  (1.854 m)  ?   Head Circumference   ?   Peak Flow   ?   Pain Score 0  ?   Pain Loc   ?   Pain Edu?   ?   Excl. in GC?   ? ? ?Most recent vital signs: ?Vitals:  ? 02/25/22 0239 02/25/22 0300  ?BP: 116/77 108/60  ?Pulse: 64 (!) 57  ?Resp: 20 19  ?Temp:    ?SpO2: 100% 100%  ? ? ? ?Constitutional: Alert and oriented, actively vomiting.  ?HEENT: ?     Head: Normocephalic and atraumatic.    ?     Eyes: Conjunctivae are normal. Sclera is non-icteric.  ?     Mouth/Throat: Mucous membranes are moist.  ?     Neck: Supple with no signs of meningismus. ?Cardiovascular: Regular rate and rhythm. No murmurs, gallops, or rubs. 2+ symmetrical distal pulses are present in all  extremities.  ?Respiratory: Normal respiratory effort. Lungs are clear to auscultation bilaterally.  ?Gastrointestinal: Soft, non tender, and non distended with positive bowel sounds. No rebound or guarding. ?Genitourinary: No CVA tenderness. ?Musculoskeletal:  No edema, cyanosis, or erythema of extremities. ?Neurologic: Normal speech and language. Face is symmetric. Moving all extremities. No gross focal neurologic deficits are appreciated. ?Skin: Skin is warm, dry and intact. No rash noted. ?Psychiatric: Mood and affect are normal. Speech and behavior are normal. ? ?ED Results / Procedures / Treatments  ? ?Labs ?(all labs ordered are listed, but only abnormal results are displayed) ?Labs Reviewed  ?COMPREHENSIVE METABOLIC PANEL - Abnormal; Notable for the following components:  ?    Result Value  ? Potassium 3.2 (*)   ? AST 65 (*)   ? ALT 62 (*)   ? Alkaline Phosphatase 34 (*)   ? All other components within normal limits  ?CBC - Abnormal; Notable for the following components:  ? WBC 12.1 (*)   ? HCT 35.2 (*)   ? All other components within normal limits  ?HCG, QUANTITATIVE, PREGNANCY - Abnormal; Notable for the following components:  ? hCG, Beta  Francene Finders 74,081 (*)   ? All other components within normal limits  ?LIPASE, BLOOD  ?URINALYSIS, ROUTINE W REFLEX MICROSCOPIC  ?POC URINE PREG, ED  ? ? ? ?EKG ? ?none ? ? ?RADIOLOGY ?I, Nita Sickle, attending MD, have personally viewed and interpreted the images obtained during this visit as below: ? ?Ultrasound showing normal IUP measuring 7 weeks and 6 days ? ?Right upper quadrant ultrasound showing fatty liver ? ? ?___________________________________________________ ?Interpretation by Radiologist:  ?US OB LESS THAN 14 WEEKS WITH OB TRANSVAGINAL ? ?Result Date: 02/25/2022 ?CLINICAL DATA:  Vomiting for 4 days EXAM: OBSTETRIC <14 WK [place] AND TRANSVAGINAL OB US TECHNIQUE: Both transabdominal and transvaginal ultrasound examinations were performed for complete  evaluation of the gestation as well as the maternal uterus, adnexal regions, and pelvic cul-de-sac. Transvaginal technique was performed to assess early pregnancy. COMPARISON:  None. FINDINGS: Intrauterine gestational sac: Single Yolk sac:  Visualized. Embryo:  Visualized. Cardiac Activity: Visualized. Heart Rate: 170 bpm CRL:  17 mm   7 w   6 d                  [place] EDC: 10/08/2022 Subchorionic hemorrhage:  None visualized Maternal uterus/adnexae: Corpus luteum on the left. 2.7 cm primarily cyst with thin septation in the right ovary, likely previously hemorrhagic follicle. IMPRESSION: 1. Single living intrauterine pregnancy measuring 7 weeks 6 days. 2. Thinly septated 2.7 cm right ovarian cyst, likely hemorrhagic follicle. Attention on follow-up US. Electronically Signed   By: Tiburcio Pea M.D.   On: 02/25/2022 05:10  ? ?US ABDOMEN LIMITED RUQ (LIVER/GB) ? ?Result Date: 02/25/2022 ?CLINICAL DATA:  Hepatic transaminitis. EXAM: ULTRASOUND ABDOMEN LIMITED RIGHT UPPER QUADRANT COMPARISON:  None. FINDINGS: Gallbladder: No gallstones or wall thickening visualized. No sonographic Murphy sign noted by sonographer. Common bile duct: Diameter: 2.7 mm with no appreciable intrahepatic biliary dilatation. Liver: No focal lesion identified. There is mild generalized increased hepatic echogenicity of steatosis. Portal vein is patent on color Doppler imaging with normal direction of blood flow towards the liver. Other: None. IMPRESSION: Increased hepatic echogenicity consistent with steatosis. Otherwise negative ultrasound. Electronically Signed   By: Almira Bar M.D.   On: 02/25/2022 05:10   ? ? ? ?PROCEDURES: ? ?Critical Care performed: No ? ?Procedures ? ? ? ?IMPRESSION / MDM / ASSESSMENT AND PLAN / ED COURSE  ?I reviewed the triage vital signs and the nursing notes. ? ?26 y.o. female with a history of hyperemesis gravidarum in the past, current pregnancy with approximate gestational age of [redacted] weeks per LMP, hypothyroidism  who presents for evaluation of nausea and vomiting x 4 days.  Patient left [place] earlier today after being admitted for 24 hours for hyperemesis gravidarum.  Continues to be unable to tolerate p.o.  Denies any abdominal pain.  She is actively vomiting with stable vitals, abdomen is soft and nontender.  We will initiate IV hydration with D5 LR, IV Zofran, will get basic labs including CBC, CMP, lipase, hCG.  Patient denies having an ultrasound for this current pregnancy.  Initial review of her chart did not show any available records from outside hospital therefore an ultrasound was ordered.  Eventually care everywhere updated and showed the patient did have an ultrasound just 6 days ago confirming an IUP measuring [redacted] weeks gestational age.  Unfortunately patient had already undergone a a repeat ultrasound here which shows a normal IUP.  Labs with no AKI or significant electrolyte derangements.  She does have mildly elevated LFTs which  is new when compared to prior with normal lipase, mildly elevated white count of 12.2 most likely stress-induced from nausea and vomiting.  Beta quant of 88,000.  Right upper quadrant ultrasound was done for the transaminitis which showed fatty liver.  Patient received IV Zofran and was still complaining of nausea therefore she was given IV Reglan.  She still complaining of severe nausea and does not wish to go home.  Consulted the hospitalist service and after discussion she has been accepted to their service ? ? ?MEDICATIONS GIVEN IN ED: ?Medications  ?dextrose 5 % in lactated ringers infusion (0 mLs Intravenous Stopped 02/25/22 0339)  ?lactated ringers infusion (has no administration in time range)  ?ondansetron (ZOFRAN) injection 4 mg (4 mg Intravenous Given 02/25/22 0230)  ?metoCLOPramide (REGLAN) injection 10 mg (10 mg Intravenous Given 02/25/22 0452)  ? ? ?Consults: Hospitalist ? ? ?EMR reviewed including records from her recent admission at [facility] ? ? ? ?FINAL  CLINICAL IMPRESSION(S) / ED DIAGNOSES  ? ?Final diagnoses:  ?Hyperemesis gravidarum  ?Intractable nausea and vomiting  ? ? ? ?Rx / DC Orders  ? ?ED Discharge Orders   ? ? None  ? ?  ? ? ? ?Note:  This document was prepar

## 2022-02-25 NOTE — H&P (Addendum)
?  ?  ?[place] ? ? ?PATIENT NAME: Debra Murray   ? ?MR#:  [id number] ? ?DATE OF BIRTH:  1996/09/09 ? ?DATE OF ADMISSION:  02/25/2022 ? ?PRIMARY CARE PHYSICIAN: Pcp, No  ? ?Patient is coming from: Home ? ?REQUESTING/REFERRING PHYSICIAN: Rudene Re, MD ? ?CHIEF COMPLAINT:  ? ?Chief Complaint  ?Patient presents with  ? Nausea  ? ? ?HISTORY OF PRESENT ILLNESS:  ?Debra Murray is a 26 y.o. female who is [redacted] weeks pregnant with medical history significant for hyperemesis gravidarum, who presented to the ER with acute onset of intractable nausea and vomiting for the last 24 hours.  She was just hospitalized over 24 hours in [facility] for similar symptoms and thought she was getting better however her symptoms recurred after discharge.  Despite IV Reglan and Zofran in the ER she continued to have nausea and vomiting.  She denied any abdominal pain.  No diarrhea or melena or bright red bleeding per rectum.  She admits to occasional bloody vomitus.  No dysuria, oliguria or hematuria or flank pain.  No cough or wheezing or hemoptysis.  No other bleeding diathesis. ?ED Course: When she came to the ER heart rate was 56 with otherwise normal vital signs.  Labs revealed hypokalemia of 3.2 and elevated AST of 65 and ALT of 62 with otherwise unremarkable CMP.  Lipase was 26 and CBC showed mild leukocytosis of 12.1.  hCG was [id number].   ? ?Imaging: Right upper quad ultrasound revealed hepatic steatosis and was otherwise negative.  OB ultrasound revealed a single living IUP measuring 7 weeks and 6 days and thinly septated 2.7 cm right ovarian cyst likely hemorrhagic follicle. ? ?The patient was given 1 L bolus of IV lactated Ringer followed by 125 mill per hour in addition to 4 mg of IV Zofran and 10 mg of IV Reglan.  She will be admitted to an observation medical telemetry bed for further evaluation and management. ? ? ? ?PAST MEDICAL HISTORY:  ?  Hyperemesis gravidarum in previous pregnancy. ? ?PAST SURGICAL HISTORY:  ?   Tonsillectomy and adenoidectomy. ? ?SOCIAL HISTORY:  ? ?Social History  ? ?Tobacco Use  ? Smoking status: Not on file  ? Smokeless tobacco: Not on file  ?Substance Use Topics  ? Alcohol use: Not on file  ?She denies any specific or EtOH abuse or illicit drug use. ? ?FAMILY HISTORY:  ? Positive for MI in her grandmother. ? ?DRUG ALLERGIES:  ?No Known Allergies ? ?REVIEW OF SYSTEMS:  ? ?ROS ?As per history of present illness. All pertinent systems were reviewed above. Constitutional, HEENT, cardiovascular, respiratory, GI, GU, musculoskeletal, neuro, psychiatric, endocrine, integumentary and hematologic systems were reviewed and are otherwise negative/unremarkable except for positive findings mentioned above in the HPI. ? ? ?MEDICATIONS AT HOME:  ? ?Prior to Admission medications   ?Not on File  ? ?  ? ?VITAL SIGNS:  ?Blood pressure 129/76, pulse 61, temperature 98 ?F (36.7 ?C), temperature source Oral, resp. rate 17, height 6\' 1"  (1.854 m), weight 77.6 kg, last menstrual period 01/02/2022, SpO2 100 %. ? ?PHYSICAL EXAMINATION:  ?Physical Exam ? ?GENERAL:  26 y.o.-year-old African-American female patient lying in the bed with no acute distress.  ?EYES: Pupils equal, round, reactive to light and accommodation. No scleral icterus. Extraocular muscles intact.  ?HEENT: Head atraumatic, normocephalic. Oropharynx with dry mucous membrane and tongue and nasopharynx clear.  ?NECK:  Supple, no jugular venous distention. No thyroid enlargement, no tenderness.  ?LUNGS: Normal breath sounds bilaterally, no  wheezing, rales,rhonchi or crepitation. No use of accessory muscles of respiration.  ?CARDIOVASCULAR: Regular rate and rhythm, S1, S2 normal. No murmurs, rubs, or gallops.  ?ABDOMEN: Soft, nondistended, nontender. Bowel sounds present. No organomegaly or mass.  ?EXTREMITIES: No pedal edema, cyanosis, or clubbing.  ?NEUROLOGIC: Cranial nerves II through XII are intact. Muscle strength 5/5 in all extremities. Sensation intact.  Gait not checked.  ?PSYCHIATRIC: The patient is alert and oriented x 3.  Normal affect and good eye contact. ?SKIN: No obvious rash, lesion, or ulcer.  ? ?LABORATORY PANEL:  ? ?CBC ?Recent Labs  ?Lab 02/25/22 ?0114  ?WBC 12.1*  ?HGB 12.0  ?HCT 35.2*  ?PLT 298  ? ?------------------------------------------------------------------------------------------------------------------ ? ?Chemistries  ?Recent Labs  ?Lab 02/25/22 ?0114  ?NA 136  ?K 3.2*  ?CL 104  ?CO2 24  ?GLUCOSE 98  ?BUN 9  ?CREATININE 0.54  ?CALCIUM 9.1  ?AST 65*  ?ALT 62*  ?ALKPHOS 34*  ?BILITOT 1.1  ? ?------------------------------------------------------------------------------------------------------------------ ? ?Cardiac Enzymes ?No results for input(s): TROPONINI in the last 168 hours. ?------------------------------------------------------------------------------------------------------------------ ? ?RADIOLOGY:  ?US OB LESS THAN 14 WEEKS WITH OB TRANSVAGINAL ? ?Result Date: 02/25/2022 ?CLINICAL DATA:  Vomiting for 4 days EXAM: OBSTETRIC <14 WK [place] AND TRANSVAGINAL OB US TECHNIQUE: Both transabdominal and transvaginal ultrasound examinations were performed for complete evaluation of the gestation as well as the maternal uterus, adnexal regions, and pelvic cul-de-sac. Transvaginal technique was performed to assess early pregnancy. COMPARISON:  None. FINDINGS: Intrauterine gestational sac: Single Yolk sac:  Visualized. Embryo:  Visualized. Cardiac Activity: Visualized. Heart Rate: 170 bpm CRL:  17 mm   7 w   6 d                  [place] EDC: 10/08/2022 Subchorionic hemorrhage:  None visualized Maternal uterus/adnexae: Corpus luteum on the left. 2.7 cm primarily cyst with thin septation in the right ovary, likely previously hemorrhagic follicle. IMPRESSION: 1. Single living intrauterine pregnancy measuring 7 weeks 6 days. 2. Thinly septated 2.7 cm right ovarian cyst, likely hemorrhagic follicle. Attention on follow-up US. Electronically Signed   By: Jorje Guild M.D.   On: 02/25/2022 05:10  ? ?US ABDOMEN LIMITED RUQ (LIVER/GB) ? ?Result Date: 02/25/2022 ?CLINICAL DATA:  Hepatic transaminitis. EXAM: ULTRASOUND ABDOMEN LIMITED RIGHT UPPER QUADRANT COMPARISON:  None. FINDINGS: Gallbladder: No gallstones or wall thickening visualized. No sonographic Murphy sign noted by sonographer. Common bile duct: Diameter: 2.7 mm with no appreciable intrahepatic biliary dilatation. Liver: No focal lesion identified. There is mild generalized increased hepatic echogenicity of steatosis. Portal vein is patent on color Doppler imaging with normal direction of blood flow towards the liver. Other: None. IMPRESSION: Increased hepatic echogenicity consistent with steatosis. Otherwise negative ultrasound. Electronically Signed   By: Telford Nab M.D.   On: 02/25/2022 05:10   ? ? ? ?IMPRESSION AND PLAN:  ?Assessment and Plan: ?* Hyperemesis gravidarum ?- The patient will be admitted to an observation medical telemetry bed. ?- We will continue hydration with IV normal saline with added potassium chloride. ?- Antiemetics will be provided. ?- IV PPI therapy will be given. ? ?Hypokalemia ?- We will replace potassium and check magnesium level. ? ?GI bleeding ?- She mentioned the fact that she had bloody vomitus. ?- We will follow serial H&H and obtain GI consult. ?-I notified Dr. And about the patient. ? ?Dr Vicente Males* ? ? ?DVT prophylaxis: SCDs.   ?Advanced Care Planning:  Code Status: full code. ?Family Communication:  The plan of care was discussed in details  with the patient (and family). ?I answered all questions. The patient agreed to proceed with the above mentioned plan. Further management will depend upon hospital course. ?Disposition Plan: Back to previous home environment ?Consults called: GI consult and OB consult. ?All the records are reviewed and case discussed with ED provider. ? ?Status is: Observation ? ?I certify that at the time of admission, it is my clinical judgment that the  patient will require hospital care extending LESS than 2 midnights. ?              ?             Dispo: The patient is from: Home ?             Anticipated d/c is to: Home ?             Patient currently is n

## 2022-02-25 NOTE — Discharge Instructions (Addendum)
Pt advised to cont with liquid diet for now and advance as tolerated. She is advised to return to ER if s/s worsen. ? ?FOLLOW up [facility] ob-GYN on your appt or in 2-3 days ?

## 2022-02-25 NOTE — ED Provider Notes (Signed)
? ?[facility] ?Provider Note ? ? ? Event Date/Time  ? First MD Initiated Contact with Patient 02/25/22 2311   ?  (approximate) ? ? ?History  ? ?Emesis and Chest Pain ? ? ?HPI ? ?Debra Murray is a 26 y.o. female who returns to the ED from home with continued nausea/vomiting.  Patient is G3, P2 approximately [redacted] weeks pregnant who has recently had a 24-hour admission at [facility], left AMA came to [facility] and was admitted overnight and discharged this afternoon.  Patient reports vomiting within 30 minutes of discharge and has continued to have vomiting.  Now having chest pain on vomiting with minor streaks of blood with her emesis.  Denies fever, cough, abdominal pain, vaginal bleeding, dysuria or diarrhea. ?  ? ? ?Past Medical History  ? ?Past Medical History:  ?Diagnosis Date  ? Asthma   ? ? ? ?Active Problem List  ? ?Patient Active Problem List  ? Diagnosis Date Noted  ? Hyperemesis gravidarum 02/25/2022  ? GI bleeding 02/25/2022  ? Hypokalemia 02/25/2022  ? ? ? ?Past Surgical History  ? ?Past Surgical History:  ?Procedure Laterality Date  ? TONSILLECTOMY    ? as a child  ? ? ? ?Home Medications  ? ?Prior to Admission medications   ?Medication Sig Start Date End Date Taking? Authorizing Provider  ?ondansetron (ZOFRAN-ODT) 4 MG disintegrating tablet Take by mouth. 02/16/22   [provider]  ?promethazine (PHENERGAN) 25 MG suppository Place 1 suppository (25 mg total) rectally every 8 (eight) hours as needed for nausea or vomiting. 02/25/22   Fritzi Mandes, MD  ?promethazine (PHENERGAN) 25 MG tablet Take 25 mg by mouth every 6 (six) hours as needed. 02/24/22   [provider]  ? ? ? ?Allergies  ?Patient has no known allergies. ? ? ?Family History  ? ?Family History  ?Problem Relation Age of Onset  ? Hypertension Mother   ? Hypertension Father   ? ? ? ?Physical Exam  ?Triage Vital Signs: ?ED Triage Vitals  ?Enc Vitals Group  ?   BP 02/25/22 2314 117/65  ?   Pulse Rate  02/25/22 2250 (!) 58  ?   Resp 02/25/22 2250 20  ?   Temp 02/25/22 2250 98.5 ?F (36.9 ?C)  ?   Temp Source 02/25/22 2250 Oral  ?   SpO2 02/25/22 2250 98 %  ?   Weight 02/25/22 2250 170 lb (77.1 kg)  ?   Height 02/25/22 2250 6\' 1"  (1.854 m)  ?   Head Circumference --   ?   Peak Flow --   ?   Pain Score --   ?   Pain Loc --   ?   Pain Edu? --   ?   Excl. in [place]? --   ? ? ?Updated Vital Signs: ?BP 108/64   Pulse 70   Temp 98.5 ?F (36.9 ?C) (Oral)   Resp 17   Ht 6\' 1"  (1.854 m)   Wt 77.1 kg   LMP 01/02/2022   SpO2 100%   BMI 22.43 kg/m?  ? ? ?General: Awake, moderate distress.  ?CV:  RRR.  Good peripheral perfusion.  ?Resp:  Normal effort.  CTA B. ?Abd:  Actively vomiting liquids.  No hematemesis.  Nontender to light or deep palpation.  No distention.  ?Other:  No calf tenderness or swelling. ? ? ?ED Results / Procedures / Treatments  ?Labs ?(all labs ordered are listed, but only abnormal results are displayed) ?Labs Reviewed  ?CBC  WITH DIFFERENTIAL/PLATELET - Abnormal; Notable for the following components:  ?    Result Value  ? WBC 11.1 (*)   ? RBC 3.48 (*)   ? Hemoglobin 10.7 (*)   ? HCT 31.1 (*)   ? Neutro Abs 8.0 (*)   ? All other components within normal limits  ?COMPREHENSIVE METABOLIC PANEL - Abnormal; Notable for the following components:  ? Potassium 2.9 (*)   ? Creatinine, Ser 0.33 (*)   ? Calcium 8.8 (*)   ? ALT 75 (*)   ? Alkaline Phosphatase 30 (*)   ? All other components within normal limits  ?LIPASE, BLOOD  ?URINALYSIS, ROUTINE W REFLEX MICROSCOPIC  ? ? ? ?EKG ? ?ED ECG REPORT ?I, Paulette Blanch, the attending physician, personally viewed and interpreted this ECG. ? ? Date: 02/25/2022 ? EKG Time: 2256 ? Rate: 60 ? Rhythm: normal sinus rhythm ? Axis: Normal ? Intervals:none ? ST&T Change: Nonspecific ? ? ? ?RADIOLOGY ?None ? ? ?Official radiology report(s): ?US OB LESS THAN 14 WEEKS WITH OB TRANSVAGINAL ? ?Result Date: 02/25/2022 ?CLINICAL DATA:  Vomiting for 4 days EXAM: OBSTETRIC <14 WK [place] AND  TRANSVAGINAL OB US TECHNIQUE: Both transabdominal and transvaginal ultrasound examinations were performed for complete evaluation of the gestation as well as the maternal uterus, adnexal regions, and pelvic cul-de-sac. Transvaginal technique was performed to assess early pregnancy. COMPARISON:  None. FINDINGS: Intrauterine gestational sac: Single Yolk sac:  Visualized. Embryo:  Visualized. Cardiac Activity: Visualized. Heart Rate: 170 bpm CRL:  17 mm   7 w   6 d                  [place] EDC: 10/08/2022 Subchorionic hemorrhage:  None visualized Maternal uterus/adnexae: Corpus luteum on the left. 2.7 cm primarily cyst with thin septation in the right ovary, likely previously hemorrhagic follicle. IMPRESSION: 1. Single living intrauterine pregnancy measuring 7 weeks 6 days. 2. Thinly septated 2.7 cm right ovarian cyst, likely hemorrhagic follicle. Attention on follow-up US. Electronically Signed   By: Jorje Guild M.D.   On: 02/25/2022 05:10  ? ?US ABDOMEN LIMITED RUQ (LIVER/GB) ? ?Result Date: 02/25/2022 ?CLINICAL DATA:  Hepatic transaminitis. EXAM: ULTRASOUND ABDOMEN LIMITED RIGHT UPPER QUADRANT COMPARISON:  None. FINDINGS: Gallbladder: No gallstones or wall thickening visualized. No sonographic Murphy sign noted by sonographer. Common bile duct: Diameter: 2.7 mm with no appreciable intrahepatic biliary dilatation. Liver: No focal lesion identified. There is mild generalized increased hepatic echogenicity of steatosis. Portal vein is patent on color Doppler imaging with normal direction of blood flow towards the liver. Other: None. IMPRESSION: Increased hepatic echogenicity consistent with steatosis. Otherwise negative ultrasound. Electronically Signed   By: Telford Nab M.D.   On: 02/25/2022 05:10   ? ? ?PROCEDURES: ? ?Critical Care performed: Yes, see critical care procedure note(s) ? ?CRITICAL CARE ?Performed by: Paulette Blanch ? ? ?Total critical care time: 30 minutes ? ?Critical care time was exclusive of separately  billable procedures and treating other patients. ? ?Critical care was necessary to treat or prevent imminent or life-threatening deterioration. ? ?Critical care was time spent personally by me on the following activities: development of treatment plan with patient and/or surrogate as well as nursing, discussions with consultants, evaluation of patient's response to treatment, examination of patient, obtaining history from patient or surrogate, ordering and performing treatments and interventions, ordering and review of laboratory studies, ordering and review of radiographic studies, pulse oximetry and re-evaluation of patient's condition. ? ? ?.1-3 Lead EKG Interpretation ?Performed by:  Paulette Blanch, MD ?Authorized by: Paulette Blanch, MD  ? ?  Interpretation: normal   ?  ECG rate:  60 ?  ECG rate assessment: normal   ?  Rhythm: sinus rhythm   ?  Ectopy: none   ?  Conduction: normal   ?Comments:  ?   Patient placed on cardiac monitor to evaluate for arrhythmias ?Ultrasound ED OB Pelvic ? ?Date/Time: 02/26/2022 1:46 AM ?Performed by: Paulette Blanch, MD ?Authorized by: Paulette Blanch, MD  ? ?Procedure details:  ?  Indications: evaluate for IUP   ?  Assess:  Intrauterine pregnancy ?  Technique:  Transabdominal obstetric (HCG+) exam ?  Images: not archived ?   ?Uterine findings:  ?  Intrauterine pregnancy: identified   ?  Fetal heart rate: identified   ?   ? ? ?MEDICATIONS ORDERED IN ED: ?Medications  ?famotidine (PEPCID) IVPB 20 mg premix (has no administration in time range)  ?promethazine (PHENERGAN) 25 mg in sodium chloride 0.9 % 50 mL IVPB (has no administration in time range)  ?dextrose 5 %-0.45 % sodium chloride infusion ( Intravenous New Bag/Given 02/25/22 2343)  ?promethazine (PHENERGAN) 25 mg in sodium chloride 0.9 % 50 mL IVPB (0 mg Intravenous Stopped 02/26/22 0021)  ?0.9 % NaCl with KCl 20 mEq/ L  infusion ( Intravenous New Bag/Given 02/26/22 0042)  ? ? ? ?IMPRESSION / MDM / ASSESSMENT AND PLAN / ED COURSE  ?I  reviewed the triage vital signs and the nursing notes. ?             ?               ?26 year old female who returns to the ED for continued nausea/vomiting in pregnancy. Differential diagnosis includes, but is not limited

## 2022-02-25 NOTE — Assessment & Plan Note (Addendum)
-   The patient will be admitted to an observation medical telemetry bed. ?- We will continue hydration with IV normal saline with added potassium chloride. ?- Antiemetics will be provided. ?- IV PPI therapy will be given. ?- OB consult will be obtained. ?

## 2022-02-25 NOTE — Progress Notes (Signed)
Patient admitted with nausea and intractable vomiting. Had episode of coffee ground with mention of hematemesis in the H and P. Patient this morning denies any vomiting. She has nausea. Spits into the emesis bag. No blood he vomit seen by RN as well ?Tolerated some clear liquid diet. Advanced to full liquid. Seen by OB Dr. Feliberto Gottron ?continue IV fluids, potassium replacement, advance diet as tolerated, antiemetics. ?Patient is eight weeks pregnant ?No family at bedside ?

## 2022-02-25 NOTE — Progress Notes (Addendum)
Patient ID: Debra Murray, female   DOB: 1995-12-09, 26 y.o.   MRN: [id number] ? ?Consult History and Physical  ? ?SERVICE: Gynecology consult from Dr Joylene Igo for first trimester Hyperemesis ? ?Patient Name: Debra Murray ?Patient MRN:   [id number] ? ?CC: n/v for the last 3 days  ? ?HPI: Debra Murray is a 26 y.o. G3p2 at &7+6 weeks based on LMP 01/02/22 and u/s that confirms  ?Pt with significant N/V for the past several day . Admitted to [facility] and d/c yesterday . She could tolerate po tabs and liquids and called EMS and they brought her here.  ?+ hematemesis , bright red and coffee ground . Similar history with her second pregnancy . ? ? ? ? ?Review of Systems: positives in bold ?GEN:   fevers, chills, weight changes, appetite changes, fatigue, night sweats ?HEENT:  HA, vision changes, hearing loss, congestion, rhinorrhea, sinus pressure, dysphagia ?CV:   CP, palpitations ?PULM:  SOB, cough ?GI:  abd pain, + nausea , emesis , hematemesis ?GU:  dysuria, urgency, frequency ?MSK:  arthralgias, myalgias, back pain, swelling ?SKIN:  rashes, color changes, pallor ?NEURO:  numbness, weakness, tingling, seizures, dizziness, tremors ?PSYCH:  depression, anxiety, behavioral problems, confusion  ?HEME/LYMPH:  easy bruising or bleeding ?ENDO:  heat/cold intolerance ? ?Past Obstetrical History: ?G3P2 ? ?Past Gynecologic History: ?Patient's last menstrual period was 01/02/2022.  ? ?Past Medical History: ?Past Medical History:  ?Diagnosis Date  ? Asthma   ? ? ?Past Surgical History: ?  ?Past Surgical History:  ?Procedure Laterality Date  ? TONSILLECTOMY    ? as a child  ? ? ?Family History:  ?family history is not on file. ? ?Social History:  ?Social History  ? ?Socioeconomic History  ? Marital status: Single  ?  Spouse name: Not on file  ? Number of children: Not on file  ? Years of education: Not on file  ? Highest education level: Not on file  ?Occupational History  ? Not on file  ?Tobacco Use  ? Smoking status: Not on file  ?  Smokeless tobacco: Not on file  ?Substance and Sexual Activity  ? Alcohol use: Not on file  ? Drug use: Not on file  ? Sexual activity: Not on file  ?Other Topics Concern  ? Not on file  ?Social History Narrative  ? Not on file  ? ?Social Determinants of Health  ? ?Financial Resource Strain: Not on file  ?Food Insecurity: Not on file  ?Transportation Needs: Not on file  ?Physical Activity: Not on file  ?Stress: Not on file  ?Social Connections: Not on file  ?Intimate Partner Violence: Not on file  ? ? ?Home Medications:  ?Medications reconciled in EPIC  ?No current facility-administered medications on file prior to encounter.  ? ?No current outpatient medications on file prior to encounter.  ? ? ?Allergies:  ?No Known Allergies ? ?Physical Exam:  ?Temp:  [98 ?F (36.7 ?C)-98.6 ?F (37 ?C)] 98.6 ?F (37 ?C) (04/20 6237) ?Pulse Rate:  [56-69] 66 (04/20 0939) ?Resp:  [17-20] 18 (04/20 0939) ?BP: (108-130)/(60-84) 115/66 (04/20 6283) ?SpO2:  [98 %-100 %] 99 % (04/20 0939) ?Weight:  [77.6 kg] 77.6 kg (04/20 0111) ? ? ?General Appearance:  Well developed, well nourished, no acute distress, alert and oriented x3 ?HEENT:  Normocephalic atraumatic, extraocular movements intact, moist mucous membranes ?Cardiovascular:  Normal S1/S2, regular rate and rhythm, no murmurs ?Pulmonary:  clear to auscultation, no wheezes, rales or rhonchi, symmetric air entry, good air exchange ?Abdomen:  Bowel sounds present, soft, nontender, nondistended, no abnormal masses, no epigastric pain ? ? ? ?Labs/Studies:  ? ?CBC and Coags:  ?Lab Results  ?Component Value Date  ? WBC 12.1 (H) 02/25/2022  ? HGB 10.9 (L) 02/25/2022  ? HCT 31.9 (L) 02/25/2022  ? MCV 90.3 02/25/2022  ? PLT 298 02/25/2022  ? ?CMP:  ?Lab Results  ?Component Value Date  ? NA 136 02/25/2022  ? K 3.2 (L) 02/25/2022  ? CL 104 02/25/2022  ? CO2 24 02/25/2022  ? BUN 9 02/25/2022  ? CREATININE 0.54 02/25/2022  ? PROT 7.1 02/25/2022  ? BILITOT 1.1 02/25/2022  ? ALT 62 (H) 02/25/2022  ?  AST 65 (H) 02/25/2022  ? ALKPHOS 34 (L) 02/25/2022  ? ? ?Other Imaging: ?US OB LESS THAN 14 WEEKS WITH OB TRANSVAGINAL ? ?Result Date: 02/25/2022 ?CLINICAL DATA:  Vomiting for 4 days EXAM: OBSTETRIC <14 WK [place] AND TRANSVAGINAL OB US TECHNIQUE: Both transabdominal and transvaginal ultrasound examinations were performed for complete evaluation of the gestation as well as the maternal uterus, adnexal regions, and pelvic cul-de-sac. Transvaginal technique was performed to assess early pregnancy. COMPARISON:  None. FINDINGS: Intrauterine gestational sac: Single Yolk sac:  Visualized. Embryo:  Visualized. Cardiac Activity: Visualized. Heart Rate: 170 bpm CRL:  17 mm   7 w   6 d                  [place] EDC: 10/08/2022 Subchorionic hemorrhage:  None visualized Maternal uterus/adnexae: Corpus luteum on the left. 2.7 cm primarily cyst with thin septation in the right ovary, likely previously hemorrhagic follicle. IMPRESSION: 1. Single living intrauterine pregnancy measuring 7 weeks 6 days. 2. Thinly septated 2.7 cm right ovarian cyst, likely hemorrhagic follicle. Attention on follow-up US. Electronically Signed   By: Tiburcio Pea M.D.   On: 02/25/2022 05:10  ? ?US ABDOMEN LIMITED RUQ (LIVER/GB) ? ?Result Date: 02/25/2022 ?CLINICAL DATA:  Hepatic transaminitis. EXAM: ULTRASOUND ABDOMEN LIMITED RIGHT UPPER QUADRANT COMPARISON:  None. FINDINGS: Gallbladder: No gallstones or wall thickening visualized. No sonographic Murphy sign noted by sonographer. Common bile duct: Diameter: 2.7 mm with no appreciable intrahepatic biliary dilatation. Liver: No focal lesion identified. There is mild generalized increased hepatic echogenicity of steatosis. Portal vein is patent on color Doppler imaging with normal direction of blood flow towards the liver. Other: None. IMPRESSION: Increased hepatic echogenicity consistent with steatosis. Otherwise negative ultrasound. Electronically Signed   By: Almira Bar M.D.   On: 02/25/2022 05:10    ? ? ?Assessment / Plan:  ? ?Debra Murray is a 26 y.o. G3P2 with +IUP at 7+6 weeks with significant Hyperemesis gravidarum . Also with hematemesis. ?1. IVF  , antinausea meds . IV reglan 10 mg TID , Zofran 4-8 mg iv q 6 hrs , Doxylamine 25 mg po bid. GI eval if hematemesis persists for possible esophageal tear .  ?She may benefit from rectal suppositories Phenergan 25 mg q 6 prn when d/c home instead of the Compazine  ?45 minutes in patient care  ?She may follow up with [facility]  1week after d/c if she wishes to continue OB care at [facility] or follow with ob in [facility]  ? ?Available for additional follow up if needed  ? ? ?Thank you for the opportunity to be involved with this pt's care.   ?

## 2022-02-25 NOTE — Assessment & Plan Note (Signed)
-   She mentioned the fact that she had bloody vomitus. ?- We will follow serial H&H and obtain GI consult. ?-I notified Dr. And about the patient. ?

## 2022-02-25 NOTE — Assessment & Plan Note (Signed)
-  We will replace potassium and check magnesium level. 

## 2022-02-25 NOTE — ED Triage Notes (Addendum)
Pt brought in via ems from home with from home with vomiting  pt is [redacted] weeks pregnant.  Pt was discharged from [place] today   pt states not feeling any better   no abd pain or bleeding.  Pt vomiting in triage   pt also has chest pain.  Ekg done in triage and pt taken to room 19 ?

## 2022-02-25 NOTE — Progress Notes (Signed)
Admission profile updated. ?

## 2022-02-25 NOTE — Progress Notes (Signed)
Patient discharged home (at her request and confirmation with provider). Discharge instructions and prescriptions given and reviewed with patient. Patient verbalized understanding.  ? ?Recommendation is to follow-up with [facility] at scheduled appointment or earlier if preferred.  ? ?Continue with liquid diet and advance as tolerated. Return to ER if symptoms worsen. ? ?Will be escorted out by volunteers. ?

## 2022-02-25 NOTE — ED Notes (Signed)
Pt taken to US

## 2022-02-25 NOTE — ED Triage Notes (Addendum)
Pt states she is 8 weeks preg, has had n/v since Sunday. Pt was seen at [facility] last night and given phenergan, pt states she has been unable to keep that down. Pt denies abd pain. Pt took phenergan 30 min PTA ?

## 2022-02-26 DIAGNOSIS — J45909 Unspecified asthma, uncomplicated: Secondary | ICD-10-CM | POA: Diagnosis present

## 2022-02-26 DIAGNOSIS — R1031 Right lower quadrant pain: Secondary | ICD-10-CM | POA: Diagnosis present

## 2022-02-26 DIAGNOSIS — O210 Mild hyperemesis gravidarum: Secondary | ICD-10-CM | POA: Diagnosis present

## 2022-02-26 DIAGNOSIS — F1210 Cannabis abuse, uncomplicated: Secondary | ICD-10-CM | POA: Diagnosis present

## 2022-02-26 DIAGNOSIS — O211 Hyperemesis gravidarum with metabolic disturbance: Secondary | ICD-10-CM | POA: Diagnosis present

## 2022-02-26 DIAGNOSIS — Z3A01 Less than 8 weeks gestation of pregnancy: Secondary | ICD-10-CM | POA: Diagnosis not present

## 2022-02-26 DIAGNOSIS — J4520 Mild intermittent asthma, uncomplicated: Secondary | ICD-10-CM

## 2022-02-26 DIAGNOSIS — E876 Hypokalemia: Secondary | ICD-10-CM

## 2022-02-26 DIAGNOSIS — K760 Fatty (change of) liver, not elsewhere classified: Secondary | ICD-10-CM | POA: Diagnosis present

## 2022-02-26 DIAGNOSIS — O99511 Diseases of the respiratory system complicating pregnancy, first trimester: Secondary | ICD-10-CM | POA: Diagnosis present

## 2022-02-26 DIAGNOSIS — O99321 Drug use complicating pregnancy, first trimester: Secondary | ICD-10-CM | POA: Diagnosis present

## 2022-02-26 LAB — COMPREHENSIVE METABOLIC PANEL
ALT: 75 U/L — ABNORMAL HIGH (ref 0–44)
AST: 36 U/L (ref 15–41)
Albumin: 4 g/dL (ref 3.5–5.0)
Alkaline Phosphatase: 30 U/L — ABNORMAL LOW (ref 38–126)
Anion gap: 7 (ref 5–15)
BUN: 8 mg/dL (ref 6–20)
CO2: 24 mmol/L (ref 22–32)
Calcium: 8.8 mg/dL — ABNORMAL LOW (ref 8.9–10.3)
Chloride: 106 mmol/L (ref 98–111)
Creatinine, Ser: 0.33 mg/dL — ABNORMAL LOW (ref 0.44–1.00)
GFR, Estimated: >60 mL/min (ref >60)
Glucose, Bld: 94 mg/dL (ref 70–99)
Potassium: 2.9 mmol/L — ABNORMAL LOW (ref 3.5–5.1)
Sodium: 137 mmol/L (ref 135–145)
Total Bilirubin: 1.1 mg/dL (ref 0.3–1.2)
Total Protein: 6.6 g/dL (ref 6.5–8.1)

## 2022-02-26 LAB — URINALYSIS, ROUTINE W REFLEX MICROSCOPIC
Bilirubin Urine: NEGATIVE
Glucose, UA: 150 mg/dL — AB
Hgb urine dipstick: NEGATIVE
Ketones, ur: 80 mg/dL — AB
Leukocytes,Ua: NEGATIVE
Nitrite: NEGATIVE
Protein, ur: NEGATIVE mg/dL
Specific Gravity, Urine: 1.02 (ref 1.005–1.030)
pH: 6 (ref 5.0–8.0)

## 2022-02-26 LAB — CBC
HCT: 29.5 % — ABNORMAL LOW (ref 36.0–46.0)
Hemoglobin: 10.1 g/dL — ABNORMAL LOW (ref 12.0–15.0)
MCH: 30.7 pg (ref 26.0–34.0)
MCHC: 34.2 g/dL (ref 30.0–36.0)
MCV: 89.7 fL (ref 80.0–100.0)
Platelets: 234 10*3/uL (ref 150–400)
RBC: 3.29 MIL/uL — ABNORMAL LOW (ref 3.87–5.11)
RDW: 11.8 % (ref 11.5–15.5)
WBC: 9.8 10*3/uL (ref 4.0–10.5)
nRBC: 0 % (ref 0.0–0.2)

## 2022-02-26 LAB — BASIC METABOLIC PANEL
Anion gap: 6 (ref 5–15)
BUN: 6 mg/dL (ref 6–20)
CO2: 25 mmol/L (ref 22–32)
Calcium: 8.2 mg/dL — ABNORMAL LOW (ref 8.9–10.3)
Chloride: 106 mmol/L (ref 98–111)
Creatinine, Ser: 0.47 mg/dL (ref 0.44–1.00)
GFR, Estimated: >60 mL/min (ref >60)
Glucose, Bld: 87 mg/dL (ref 70–99)
Potassium: 2.9 mmol/L — ABNORMAL LOW (ref 3.5–5.1)
Sodium: 137 mmol/L (ref 135–145)

## 2022-02-26 LAB — MAGNESIUM: Magnesium: 2.1 mg/dL (ref 1.7–2.4)

## 2022-02-26 LAB — LIPASE, BLOOD: Lipase: 27 U/L (ref 11–51)

## 2022-02-26 MED ORDER — ACETAMINOPHEN 650 MG RE SUPP: 650.0000 mg | Freq: Four times a day (QID) | RECTAL | Status: DC | PRN

## 2022-02-26 MED ORDER — ONDANSETRON HCL 4 MG PO TABS
4.0000 mg | ORAL_TABLET | Freq: Four times a day (QID) | ORAL | Status: DC | PRN
  Administered 2022-02-26: 4 mg via ORAL
  Filled 2022-02-26 (×2): qty 1

## 2022-02-26 MED ORDER — PROMETHAZINE HCL 25 MG PO TABS: 25.0000 mg | ORAL_TABLET | Freq: Four times a day (QID) | ORAL | Status: DC | PRN

## 2022-02-26 MED ORDER — SODIUM CHLORIDE 0.9 % IV SOLN: INTRAVENOUS | Status: DC

## 2022-02-26 MED ORDER — ACETAMINOPHEN 325 MG PO TABS
650.0000 mg | ORAL_TABLET | Freq: Four times a day (QID) | ORAL | Status: DC | PRN
  Administered 2022-02-27: 650 mg via ORAL
  Filled 2022-02-26 (×2): qty 2

## 2022-02-26 MED ORDER — POTASSIUM CHLORIDE IN NACL 20-0.9 MEQ/L-% IV SOLN
Freq: Once | INTRAVENOUS | Status: AC
  Filled 2022-02-26: qty 1000

## 2022-02-26 MED ORDER — MAGNESIUM HYDROXIDE 400 MG/5ML PO SUSP: 30.0000 mL | Freq: Every day | ORAL | Status: DC | PRN

## 2022-02-26 MED ORDER — TRAZODONE HCL 50 MG PO TABS
25.0000 mg | ORAL_TABLET | Freq: Every evening | ORAL | Status: DC | PRN
  Filled 2022-02-26: qty 0.5

## 2022-02-26 MED ORDER — ALUM & MAG HYDROXIDE-SIMETH 200-200-20 MG/5ML PO SUSP
30.0000 mL | Freq: Four times a day (QID) | ORAL | Status: DC | PRN
Start: 2022-02-26 — End: 2022-02-28
  Administered 2022-02-26: 30 mL via ORAL
  Filled 2022-02-26: qty 30

## 2022-02-26 MED ORDER — ENOXAPARIN SODIUM 40 MG/0.4ML IJ SOSY
40.0000 mg | PREFILLED_SYRINGE | INTRAMUSCULAR | Status: DC
  Administered 2022-02-26 – 2022-02-28 (×3): 40 mg via SUBCUTANEOUS
  Filled 2022-02-26 (×3): qty 0.4

## 2022-02-26 MED ORDER — ONDANSETRON 4 MG PO TBDP: 4.0000 mg | ORAL_TABLET | Freq: Three times a day (TID) | ORAL | Status: DC | PRN

## 2022-02-26 MED ORDER — ONDANSETRON HCL 4 MG/2ML IJ SOLN
4.0000 mg | Freq: Four times a day (QID) | INTRAMUSCULAR | Status: DC | PRN
  Administered 2022-02-26 – 2022-02-28 (×5): 4 mg via INTRAVENOUS
  Filled 2022-02-26 (×5): qty 2

## 2022-02-26 MED ORDER — SODIUM CHLORIDE 0.9 % IV SOLN: INTRAVENOUS | Status: DC | PRN

## 2022-02-26 MED ORDER — SODIUM CHLORIDE 0.9 % IV SOLN
25.0000 mg | Freq: Once | INTRAVENOUS | Status: AC
  Administered 2022-02-26: 25 mg via INTRAVENOUS
  Filled 2022-02-26: qty 1

## 2022-02-26 MED ORDER — SODIUM CHLORIDE 0.9 % IV SOLN
12.5000 mg | Freq: Four times a day (QID) | INTRAVENOUS | Status: DC | PRN
  Administered 2022-02-26 (×2): 12.5 mg via INTRAVENOUS
  Filled 2022-02-26: qty 0.5
  Filled 2022-02-26 (×2): qty 12.5

## 2022-02-26 MED ORDER — PROMETHAZINE HCL 25 MG RE SUPP
25.0000 mg | Freq: Three times a day (TID) | RECTAL | Status: DC | PRN
  Administered 2022-02-27 – 2022-02-28 (×3): 25 mg via RECTAL
  Filled 2022-02-26 (×4): qty 1

## 2022-02-26 MED ORDER — ENSURE ENLIVE PO LIQD: 237.0000 mL | Freq: Three times a day (TID) | ORAL | Status: DC

## 2022-02-26 MED ORDER — DOXYLAMINE SUCCINATE (SLEEP) 25 MG PO TABS
25.0000 mg | ORAL_TABLET | Freq: Two times a day (BID) | ORAL | Status: DC
  Administered 2022-02-26 – 2022-02-27 (×3): 25 mg via ORAL
  Filled 2022-02-26 (×5): qty 1

## 2022-02-26 MED ORDER — KCL IN DEXTROSE-NACL 40-5-0.9 MEQ/L-%-% IV SOLN
INTRAVENOUS | Status: DC
  Filled 2022-02-26 (×2): qty 1000

## 2022-02-26 MED ORDER — THIAMINE HCL 100 MG PO TABS
100.0000 mg | ORAL_TABLET | Freq: Every day | ORAL | Status: DC
  Administered 2022-02-27 – 2022-02-28 (×2): 100 mg via ORAL
  Filled 2022-02-26 (×2): qty 1

## 2022-02-26 MED ORDER — POTASSIUM CHLORIDE 10 MEQ/100ML IV SOLN
10.0000 meq | INTRAVENOUS | Status: AC
  Administered 2022-02-26 (×4): 10 meq via INTRAVENOUS
  Filled 2022-02-26 (×4): qty 100

## 2022-02-26 MED ORDER — METOCLOPRAMIDE HCL 5 MG/ML IJ SOLN
10.0000 mg | Freq: Four times a day (QID) | INTRAMUSCULAR | Status: DC
  Administered 2022-02-26 – 2022-02-27 (×5): 10 mg via INTRAVENOUS
  Filled 2022-02-26 (×5): qty 2

## 2022-02-26 MED ORDER — THIAMINE HCL 100 MG/ML IJ SOLN
Freq: Once | INTRAVENOUS | Status: AC
  Filled 2022-02-26: qty 1000

## 2022-02-26 MED ORDER — PANTOPRAZOLE SODIUM 40 MG IV SOLR
40.0000 mg | Freq: Two times a day (BID) | INTRAVENOUS | Status: DC
Start: 2022-02-26 — End: 2022-02-27
  Administered 2022-02-26 (×2): 40 mg via INTRAVENOUS
  Filled 2022-02-26 (×3): qty 10

## 2022-02-26 NOTE — Progress Notes (Signed)
Patient readmitted early hours of the morning with persistent/intractable nausea vomiting with history of hyper emphasis gravity around. Patient is eight weeks pregnant. Continue IV fluids with MVI, antiemetics. ?OB CNM input appreciated ?D/w dietitian to follow regarding nutritional support ?

## 2022-02-26 NOTE — ED Notes (Signed)
Provider, Dr. Dolores Frame, assessed FHTs with [place] and FHR approx 160 ?

## 2022-02-26 NOTE — ED Notes (Signed)
Report called to Verlon Au, RN on Mother Baby ?

## 2022-02-26 NOTE — Progress Notes (Signed)
Initial Nutrition Assessment ? ?DOCUMENTATION CODES:  ? ?Not applicable ? ?INTERVENTION:  ? ?Ensure Enlive po TID, each supplement provides 350 kcal and 20 grams of protein. ? ?Snacks TID (8am, 2pm, 8pm) ? ?Recommend intravenous MVI until pt able to take po ? ?Liberalize diet  ? ?Pt at high refeed risk; recommend monitor potassium, magnesium and phosphorus labs daily until stable ? ?If patient is unable to tolerate oral diet in the next 24-48 hours recommend post pyloric NGT placement and nutrition support ? ?If tube feeds initiated, recommend: ? ?Osmolite 1.5@70ml /hr- Initiate at 45m/hr and increase by 182mhr q 8 hours until goal rate is reached.  ? ?Free water flushes 302m4 hours to maintain tube patency  ? ?Regimen provides 2520kcal/day, 105g/day protein and 1460m53my of free water  ? ?May also consider for nausea Ginger 250mg63mQID and Vitamin B6 25mg 57mID ? ?NUTRITION DIAGNOSIS:  ? ?Inadequate oral intake related to acute illness (hyperemesis) as evidenced by per patient/family report. ? ?GOAL:  ? ?Patient will meet greater than or equal to 90% of their needs ? ?MONITOR:  ? ?PO intake, Supplement acceptance, Labs, Weight trends, Skin, I & O's ? ?REASON FOR ASSESSMENT:  ? ?Consult ?Assessment of nutrition requirement/status ? ?ASSESSMENT:  ? ?26 y/o63emale with h/o marijuana use, hypothyroidism, migraines, asthma and who is G3p2 at 7 weeks who is admitted with hyperemesis gravidarum with hematemesis. ? ?Met with pt in room today. Pt reports severe nausea and vomiting that started on Sunday. Pt reports a h/o hyperemesis with a previous pregnancy but reports that it was not as severe and that it resolved towards the end of her pregnancy. Pt has been unable to keep down any more than just some liquids including her prenatal vitamins. Pt has not tried nutritional supplements. Pt reports that she does use marijuana but not since finding out she was pregnant. Pt reports continued nausea today; pt actively  spitting up at time of RD visit. Pt's main complaint is that she is tired. RD discussed with pt the importance of adequate nutrition needed for fetal development. Pt reports that she is willing to try chocolate supplements in hospital. RD will add supplements and snacks to help pt meet her estimated needs. RD will also liberalize pt's diet. Recommend intravenous MVI daily until pt is able to take po. Ginger and B6 supplementation may also be used to help with nausea. If patient is unable to tolerate oral diet in the next 24-48 hours recommend post pyloric nasogastric tube placement and nutrition support; this was discussed with pt. Pt is at high refeed risk; pharmacy consulted for electrolyte management. Pt may be discharged home with NGT and nutrition support if needed.  ? ?Pt reports her UBW is ~180lbs. Per chart, pt down 10lbs on admission but appears to be back to her UBW after fluid resuscitation.  ? ?Medications reviewed and include: unisom, lovenox, reglan, protonix, KCl, phenergan  ? ?Labs reviewed: K 2.9(L), Mg 2.1 wnl ?Hgb 10.1(L), Hct 29.5(L) ? ?NUTRITION - FOCUSED PHYSICAL EXAM: ? ?Flowsheet Row Most Recent Value  ?Orbital Region No depletion  ?Upper Arm Region No depletion  ?Thoracic and Lumbar Region No depletion  ?Buccal Region No depletion  ?Temple Region No depletion  ?Clavicle Bone Region No depletion  ?Clavicle and Acromion Bone Region No depletion  ?Scapular Bone Region No depletion  ?Dorsal Hand No depletion  ?Patellar Region Mild depletion  ?Anterior Thigh Region Mild depletion  ?Posterior Calf Region Mild depletion  ?Edema (RD Assessment)  None  ?Hair Reviewed  ?Eyes Reviewed  ?Mouth Reviewed  ?Skin Reviewed  ?Nails Reviewed  ? ?Diet Order:   ?Diet Order   ? ?       ?  Diet regular Room service appropriate? Yes; Fluid consistency: Thin  Diet effective now       ?  ? ?  ?  ? ?  ? ?EDUCATION NEEDS:  ? ?Education needs have been addressed ? ?Skin:  Skin Assessment: Reviewed RN Assessment ? ?Last  BM:  pta ? ?Height:  ? ?Ht Readings from Last 1 Encounters:  ?02/25/22 6' 1"  (1.854 m)  ? ? ?Weight:  ? ?Wt Readings from Last 1 Encounters:  ?02/26/22 82.3 kg  ? ? ?Ideal Body Weight:  83.6 kg ? ?BMI:  Body mass index is 23.95 kg/m?. ? ?Estimated Nutritional Needs:  ? ?Kcal:  2200-2500kcal/day ? ?Protein:  110-125g/day ? ?Fluid:  2.5-2.8L/day ? ?Koleen Distance MS, RD, LDN ?Please refer to AMION for RD and/or RD on-call/weekend/after hours pager ? ?

## 2022-02-26 NOTE — ED Notes (Signed)
Attempted FHTs. Maternal HR acquired. MD notified ?

## 2022-02-26 NOTE — ED Notes (Signed)
Attempted report times one. This RN was advised to call back in 15 min ?

## 2022-02-26 NOTE — Progress Notes (Addendum)
ANTEPARTUM PROGRESS NOTE ? ?Debra Murray is a 26 y.o. G3P2002 at [redacted]w[redacted]d who is admitted for hyperemesis.   ?Estimated Date of Delivery: 10/10/22 ? ?Length of Stay:  0 Days. Admitted 02/25/2022 ? ?Subjective: ?Patient resting in bed eating jello. She reports that she is able to tolerate eating jello, but not applesauce. She is reports constant nausea/vomiting, but denies pain. ? ?She reports:  ?-active fetal movement ?-no leakage of fluid ?-no vaginal bleeding ?-no contractions ? ?Vitals:  BP 124/76 (BP Location: Right Arm)   Pulse 62   Temp 98.7 ?F (37.1 ?C) (Oral)   Resp 17   Ht 6\' 1"  (1.854 m)   Wt 82.3 kg   LMP 01/02/2022   SpO2 99%   BMI 23.95 kg/m?  ?Physical Examination: ?General:   alert, cooperative, and appears stated age  ?Skin:  normal  ?Neurologic:    Alert & oriented x 3  ?Lungs:   clear to auscultation bilaterally  ?Heart:   regular rate and rhythm, S1, S2 normal, no murmur, click, rub or gallop  ?Abdomen:  soft, non-tender; bowel sounds normal; no masses,  no organomegaly  ?Pelvis:  Exam deferred.  ?   ?   ?   ?    ?    ?    ?    ?    ?    ? ? ?Results for orders placed or performed during the hospital encounter of 02/25/22 (from the past 48 hour(s))  ?Urinalysis, Routine w reflex microscopic     Status: Abnormal  ? Collection Time: 02/25/22  7:43 AM  ?Result Value Ref Range  ? Color, Urine YELLOW (A) YELLOW  ? APPearance HAZY (A) CLEAR  ? Specific Gravity, Urine 1.020 1.005 - 1.030  ? pH 6.0 5.0 - 8.0  ? Glucose, UA 150 (A) NEGATIVE mg/dL  ? Hgb urine dipstick NEGATIVE NEGATIVE  ? Bilirubin Urine NEGATIVE NEGATIVE  ? Ketones, ur 80 (A) NEGATIVE mg/dL  ? Protein, ur NEGATIVE NEGATIVE mg/dL  ? Nitrite NEGATIVE NEGATIVE  ? Leukocytes,Ua NEGATIVE NEGATIVE  ?  Comment: Performed at [facility], [address], [place], [place]  ?CBC with Differential     Status: Abnormal  ? Collection Time: 02/25/22 11:44 PM  ?Result Value Ref Range  ? WBC 11.1 (H) 4.0 - 10.5 K/uL  ? RBC 3.48  (L) 3.87 - 5.11 MIL/uL  ? Hemoglobin 10.7 (L) 12.0 - 15.0 g/dL  ? HCT 31.1 (L) 36.0 - 46.0 %  ? MCV 89.4 80.0 - 100.0 fL  ? MCH 30.7 26.0 - 34.0 pg  ? MCHC 34.4 30.0 - 36.0 g/dL  ? RDW 12.0 11.5 - 15.5 %  ? Platelets 251 150 - 400 K/uL  ? nRBC 0.0 0.0 - 0.2 %  ? Neutrophils Relative % 73 %  ? Neutro Abs 8.0 (H) 1.7 - 7.7 K/uL  ? Lymphocytes Relative 19 %  ? Lymphs Abs 2.1 0.7 - 4.0 K/uL  ? Monocytes Relative 8 %  ? Monocytes Absolute 0.9 0.1 - 1.0 K/uL  ? Eosinophils Relative 0 %  ? Eosinophils Absolute 0.0 0.0 - 0.5 K/uL  ? Basophils Relative 0 %  ? Basophils Absolute 0.0 0.0 - 0.1 K/uL  ? Immature Granulocytes 0 %  ? Abs Immature Granulocytes 0.04 0.00 - 0.07 K/uL  ?  Comment: Performed at [facility], [address], [place], [place]  ?Comprehensive metabolic panel     Status: Abnormal  ? Collection Time: 02/25/22 11:44 PM  ?  Result Value Ref Range  ? Sodium 137 135 - 145 mmol/L  ? Potassium 2.9 (L) 3.5 - 5.1 mmol/L  ? Chloride 106 98 - 111 mmol/L  ? CO2 24 22 - 32 mmol/L  ? Glucose, Bld 94 70 - 99 mg/dL  ?  Comment: Glucose reference range applies only to samples taken after fasting for at least 8 hours.  ? BUN 8 6 - 20 mg/dL  ? Creatinine, Ser 0.33 (L) 0.44 - 1.00 mg/dL  ? Calcium 8.8 (L) 8.9 - 10.3 mg/dL  ? Total Protein 6.6 6.5 - 8.1 g/dL  ? Albumin 4.0 3.5 - 5.0 g/dL  ? AST 36 15 - 41 U/L  ? ALT 75 (H) 0 - 44 U/L  ? Alkaline Phosphatase 30 (L) 38 - 126 U/L  ? Total Bilirubin 1.1 0.3 - 1.2 mg/dL  ? GFR, Estimated >60 >60 mL/min  ?  Comment: (NOTE) ?Calculated using the CKD-EPI Creatinine Equation (2021) ?  ? Anion gap 7 5 - 15  ?  Comment: Performed at [facility], [address], [place], [place] [address]  ?Lipase, blood     Status: None  ? Collection Time: 02/25/22 11:44 PM  ?Result Value Ref Range  ? Lipase 27 11 - 51 U/L  ?  Comment: Performed at [facility], [address] [place], [place] [address]  ?Basic metabolic panel     Status: Abnormal  ? Collection  Time: 02/26/22  6:02 AM  ?Result Value Ref Range  ? Sodium 137 135 - 145 mmol/L  ? Potassium 2.9 (L) 3.5 - 5.1 mmol/L  ? Chloride 106 98 - 111 mmol/L  ? CO2 25 22 - 32 mmol/L  ? Glucose, Bld 87 70 - 99 mg/dL  ?  Comment: Glucose reference range applies only to samples taken after fasting for at least 8 hours.  ? BUN 6 6 - 20 mg/dL  ? Creatinine, Ser 0.47 0.44 - 1.00 mg/dL  ? Calcium 8.2 (L) 8.9 - 10.3 mg/dL  ? GFR, Estimated >60 >60 mL/min  ?  Comment: (NOTE) ?Calculated using the CKD-EPI Creatinine Equation (2021) ?  ? Anion gap 6 5 - 15  ?  Comment: Performed at [facility], [address], [place], [place] [address]  ?CBC     Status: Abnormal  ? Collection Time: 02/26/22  6:02 AM  ?Result Value Ref Range  ? WBC 9.8 4.0 - 10.5 K/uL  ? RBC 3.29 (L) 3.87 - 5.11 MIL/uL  ? Hemoglobin 10.1 (L) 12.0 - 15.0 g/dL  ? HCT 29.5 (L) 36.0 - 46.0 %  ? MCV 89.7 80.0 - 100.0 fL  ? MCH 30.7 26.0 - 34.0 pg  ? MCHC 34.2 30.0 - 36.0 g/dL  ? RDW 11.8 11.5 - 15.5 %  ? Platelets 234 150 - 400 K/uL  ? nRBC 0.0 0.0 - 0.2 %  ?  Comment: Performed at [facility], [address] [place], [place] [address]  ?Magnesium     Status: None  ? Collection Time: 02/26/22  6:02 AM  ?Result Value Ref Range  ? Magnesium 2.1 1.7 - 2.4 mg/dL  ?  Comment: Performed at [facility], [address] [place], [place] [address]  ? ? ?US OB LESS THAN 14 WEEKS WITH OB TRANSVAGINAL ? ?Result Date: 02/25/2022 ?CLINICAL DATA:  Vomiting for 4 days EXAM: OBSTETRIC <14 WK [place] AND TRANSVAGINAL OB US TECHNIQUE: Both transabdominal and transvaginal ultrasound examinations were performed for complete evaluation of the  gestation as well as the maternal uterus, adnexal regions, and pelvic cul-de-sac. Transvaginal technique was performed to assess early pregnancy. COMPARISON:  None. FINDINGS: Intrauterine gestational sac: Single Yolk sac:  Visualized. Embryo:  Visualized. Cardiac Activity: Visualized. Heart Rate: 170 bpm CRL:  17 mm   7 w   6 d                   US EDC: 10/08/2022 Subchorionic hemorrhage:  None visualized Maternal uterus/adnexae: Corpus luteum on the left. 2.7 cm primarily cyst with thin septation in the right ovary, likely previously hemorrhagic follicle. IMPRESSION: 1. Single living intrauterine pregnancy measuring 7 weeks 6 days. 2. Thinly septated 2.7 cm right ovarian cyst, likely hemorrhagic follicle. Attention on follow-up US. Electronically Signed   By: Tiburcio PeaJonathan  Watts M.D.   On: 02/25/2022 05:10  ? ?US ABDOMEN LIMITED RUQ (LIVER/GB) ? ?Result Date: 02/25/2022 ?CLINICAL DATA:  Hepatic transaminitis. EXAM: ULTRASOUND ABDOMEN LIMITED RIGHT UPPER QUADRANT COMPARISON:  None. FINDINGS: Gallbladder: No gallstones or wall thickening visualized. No sonographic Murphy sign noted by sonographer. Common bile duct: Diameter: 2.7 mm with no appreciable intrahepatic biliary dilatation. Liver: No focal lesion identified. There is mild generalized increased hepatic echogenicity of steatosis. Portal vein is patent on color Doppler imaging with normal direction of blood flow towards the liver. Other: None. IMPRESSION: Increased hepatic echogenicity consistent with steatosis. Otherwise negative ultrasound. Electronically Signed   By: Almira BarKeith  Chesser M.D.   On: 02/25/2022 05:10   ? ?Current scheduled medications ? doxylamine (Sleep)  25 mg Oral BID  ? enoxaparin (LOVENOX) injection  40 mg Subcutaneous Q24H  ? feeding supplement  237 mL Oral TID BM  ? metoCLOPramide (REGLAN) injection  10 mg Intravenous Q6H  ? pantoprazole (PROTONIX) IV  40 mg Intravenous Q12H  ? ? ?I have reviewed the patient's current medications. ? ?ASSESSMENT: Managed by Hospitalist/consulted by OB/GYN ?Patient Active Problem List  ? Diagnosis Date Noted  ? Hyperemesis gravidarum 02/25/2022  ? GI bleeding 02/25/2022  ? Hypokalemia 02/25/2022  ? ? ?PLAN: ?-IV hydration with Banana Bag ?-continue antiemetics ?-continue oral hydration ?Continue routine antenatal care. ?-Continue POC per  Hospitalist  ? ?Dr Patel/Dr Jean RosenthalJackson aware of plan ? ? ? ? ?Chari ManningFelicia Ventura Hollenbeck, CNM ?Certified Nurse Midwife ?[facility] ?[facility]  ?  ?

## 2022-02-26 NOTE — H&P (Signed)
?  ?  ?[facility] ? ? ?PATIENT NAME: Debra Murray   ? ?MR#:  [id number] ? ?DATE OF BIRTH:  1996-02-13 ? ?DATE OF ADMISSION: 02/26/2022 ? ?PRIMARY CARE PHYSICIAN: Pcp, No  ? ?Patient is coming from: Home ? ?REQUESTING/REFERRING PHYSICIAN: Lurline Hare, MD ? ?CHIEF COMPLAINT:  ? ?Chief Complaint  ?Patient presents with  ?? Emesis  ?? Chest Pain  ? ? ?HISTORY OF PRESENT ILLNESS:  ?Debra Murray is a 26 y.o. female with medical history significant for asthma and hyperemesis gravidarum who is [redacted] weeks pregnant  who was admitted overnight and discharged yesterday for intractable nausea and vomiting with hyperemesis gravidarum.  She presented to the ER tonight with acute onset of recurrent intractable nausea and vomiting since she went home.  She was just hospitalized over 24 hours in [facility] for similar symptoms and thought she was getting better however her symptoms recurred after discharge.   She denied any abdominal pain.  No diarrhea or melena or bright red bleeding per rectum.  She admits to occasional bloody vomitus.  No dysuria, oliguria or hematuria or flank pain.  No cough or wheezing or hemoptysis.  No other bleeding diathesis. ?ED Course: Patient came to the ER heart rate was 58 with otherwise normal vital signs.  Labs revealed hypokalemia of 2.9 and a creatinine of 0.33 with a BUN of 8, calcium 8.8 alk phos of 30 ALT was 75 with 36.  CBC showed mild leukocytosis and anemia. ? ?EKG as reviewed by me : EKG revealed sinus rhythm with a rate of 60 ? ?The patient was given IV Phenergan  in the ER as well as 20 g of IV famotidine, hydration with D5 half-normal saline 1 L bolus followed by normal saline 125 mill per hour.  She will be admitted to an observation medical bed for further evaluation and management. ?PAST MEDICAL HISTORY:  ? ?Past Medical History:  ?Diagnosis Date  ?? Asthma   ? ? ?PAST SURGICAL HISTORY:  ? ?Past Surgical History:  ?Procedure Laterality Date  ?? TONSILLECTOMY    ? as a child  ? ? ?SOCIAL  HISTORY:  ? ?Social History  ? ?Tobacco Use  ?? Smoking status: Never  ?? Smokeless tobacco: Never  ?Substance Use Topics  ?? Alcohol use: Not Currently  ? ? ?FAMILY HISTORY:  ? ?Family History  ?Problem Relation Age of Onset  ?? Hypertension Mother   ?? Hypertension Father   ? ? ?DRUG ALLERGIES:  ?No Known Allergies ? ?REVIEW OF SYSTEMS:  ? ?ROS ?As per history of present illness. All pertinent systems were reviewed above. Constitutional, HEENT, cardiovascular, respiratory, GI, GU, musculoskeletal, neuro, psychiatric, endocrine, integumentary and hematologic systems were reviewed and are otherwise negative/unremarkable except for positive findings mentioned above in the HPI. ? ? ?MEDICATIONS AT HOME:  ? ?Prior to Admission medications   ?Medication Sig Start Date End Date Taking? Authorizing Provider  ?ondansetron (ZOFRAN-ODT) 4 MG disintegrating tablet Take 4 mg by mouth every 8 (eight) hours as needed. 02/16/22  Yes [provider]  ?promethazine (PHENERGAN) 25 MG suppository Place 1 suppository (25 mg total) rectally every 8 (eight) hours as needed for nausea or vomiting. 02/25/22  Yes Fritzi Mandes, MD  ?promethazine (PHENERGAN) 25 MG tablet Take 25 mg by mouth every 6 (six) hours as needed. 02/24/22  Yes [provider]  ? ?  ? ?VITAL SIGNS:  ?Blood pressure 108/64, pulse 70, temperature 98.5 ?F (36.9 ?C), temperature source Oral, resp. rate 17, height _0  (1.854  m), weight 77.1 kg, last menstrual period 01/02/2022, SpO2 100 %. ? ?PHYSICAL EXAMINATION:  ?Physical Exam ? ?GENERAL:  26 y.o.-year-old African-American female patient lying in the bed with no acute distress.  ?EYES: Pupils equal, round, reactive to light and accommodation. No scleral icterus. Extraocular muscles intact.  ?HEENT: Head atraumatic, normocephalic. Oropharynx and nasopharynx clear.  ?NECK:  Supple, no jugular venous distention. No thyroid enlargement, no tenderness.  ?LUNGS: Normal breath sounds bilaterally, no wheezing,  rales,rhonchi or crepitation. No use of accessory muscles of respiration.  ?CARDIOVASCULAR: Regular rate and rhythm, S1, S2 normal. No murmurs, rubs, or gallops.  ?ABDOMEN: Soft, nondistended, with mild epigastric tenderness without rebound tenderness guarding or rigidity.  Bowel sounds present. No organomegaly or mass.  ?EXTREMITIES: No pedal edema, cyanosis, or clubbing.  ?NEUROLOGIC: Cranial nerves II through XII are intact. Muscle strength 5/5 in all extremities. Sensation intact. Gait not checked.  ?PSYCHIATRIC: The patient is alert and oriented x 3.  Normal affect and good eye contact. ?SKIN: No obvious rash, lesion, or ulcer.  ? ?LABORATORY PANEL:  ? ?CBC ?Recent Labs  ?Lab 02/25/22 ?2344  ?WBC 11.1*  ?HGB 10.7*  ?HCT 31.1*  ?PLT 251  ? ?------------------------------------------------------------------------------------------------------------------ ? ?Chemistries  ?Recent Labs  ?Lab 02/25/22 ?2344  ?NA 137  ?K 2.9*  ?CL 106  ?CO2 24  ?GLUCOSE 94  ?BUN 8  ?CREATININE 0.33*  ?CALCIUM 8.8*  ?AST 36  ?ALT 75*  ?ALKPHOS 30*  ?BILITOT 1.1  ? ?------------------------------------------------------------------------------------------------------------------ ? ?Cardiac Enzymes ?No results for input(s): TROPONINI in the last 168 hours. ?------------------------------------------------------------------------------------------------------------------ ? ?RADIOLOGY:  ?US OB LESS THAN 14 WEEKS WITH OB TRANSVAGINAL ? ?Result Date: 02/25/2022 ?CLINICAL DATA:  Vomiting for 4 days EXAM: OBSTETRIC <14 WK [place] AND TRANSVAGINAL OB US TECHNIQUE: Both transabdominal and transvaginal ultrasound examinations were performed for complete evaluation of the gestation as well as the maternal uterus, adnexal regions, and pelvic cul-de-sac. Transvaginal technique was performed to assess early pregnancy. COMPARISON:  None. FINDINGS: Intrauterine gestational sac: Single Yolk sac:  Visualized. Embryo:  Visualized. Cardiac Activity: Visualized.  Heart Rate: 170 bpm CRL:  17 mm   7 w   6 d                  [place] EDC: 10/08/2022 Subchorionic hemorrhage:  None visualized Maternal uterus/adnexae: Corpus luteum on the left. 2.7 cm primarily cyst with thin septation in the right ovary, likely previously hemorrhagic follicle. IMPRESSION: 1. Single living intrauterine pregnancy measuring 7 weeks 6 days. 2. Thinly septated 2.7 cm right ovarian cyst, likely hemorrhagic follicle. Attention on follow-up US. Electronically Signed   By: Jorje Guild M.D.   On: 02/25/2022 05:10  ? ?US ABDOMEN LIMITED RUQ (LIVER/GB) ? ?Result Date: 02/25/2022 ?CLINICAL DATA:  Hepatic transaminitis. EXAM: ULTRASOUND ABDOMEN LIMITED RIGHT UPPER QUADRANT COMPARISON:  None. FINDINGS: Gallbladder: No gallstones or wall thickening visualized. No sonographic Murphy sign noted by sonographer. Common bile duct: Diameter: 2.7 mm with no appreciable intrahepatic biliary dilatation. Liver: No focal lesion identified. There is mild generalized increased hepatic echogenicity of steatosis. Portal vein is patent on color Doppler imaging with normal direction of blood flow towards the liver. Other: None. IMPRESSION: Increased hepatic echogenicity consistent with steatosis. Otherwise negative ultrasound. Electronically Signed   By: Telford Nab M.D.   On: 02/25/2022 05:10   ? ? ? ?IMPRESSION AND PLAN:  ?Assessment and Plan: ?* Hyperemesis gravidarum ?- The patient will be admitted to an observation medical telemetry bed. ?- We will continue hydration with IV  normal saline with added potassium chloride. ?- Antiemetics will be provided. ?- Scheduled IV Reglan and as needed Zofran and Phenergan will be provided. ?- IV PPI therapy will be given. ?  ?Hypokalemia ?- We will replace potassium and check magnesium level. ?  ?Asthma without exacerbation ?- We will place on as needed DuoNeb. ? ?DVT prophylaxis: Lovenox.  ?Advanced Care Planning:  Code Status: full code.  ?Family Communication:  The plan of care was  discussed in details with the patient (and family). ?I answered all questions. The patient agreed to proceed with the above mentioned plan. Further management will depend upon hospital course. ?Disposi

## 2022-02-27 ENCOUNTER — Inpatient Hospital Stay: Payer: Medicaid Other

## 2022-02-27 LAB — MAGNESIUM: Magnesium: 2.2 mg/dL (ref 1.7–2.4)

## 2022-02-27 LAB — BASIC METABOLIC PANEL
Anion gap: 7 (ref 5–15)
BUN: <5 mg/dL — ABNORMAL LOW (ref 6–20)
CO2: 23 mmol/L (ref 22–32)
Calcium: 8.6 mg/dL — ABNORMAL LOW (ref 8.9–10.3)
Chloride: 105 mmol/L (ref 98–111)
Creatinine, Ser: 0.38 mg/dL — ABNORMAL LOW (ref 0.44–1.00)
GFR, Estimated: >60 mL/min (ref >60)
Glucose, Bld: 85 mg/dL (ref 70–99)
Potassium: 2.9 mmol/L — ABNORMAL LOW (ref 3.5–5.1)
Sodium: 135 mmol/L (ref 135–145)

## 2022-02-27 LAB — PHOSPHORUS: Phosphorus: 3.3 mg/dL (ref 2.5–4.6)

## 2022-02-27 IMAGING — US US OB COMP LESS 14 WK
1 series · 14 of 28 positions shown · non-contrast
Comparison: [DATE]

CLINICAL DATA: Right lower quadrant pain since this morning.

EXAM:
OBSTETRIC <14 WK ULTRASOUND
TECHNIQUE: Transabdominal ultrasound was performed for evaluation of the
gestation as well as the maternal uterus and adnexal regions.

[Series 1: us ob less than 14 weeks with ob transvaginal · 14 of 55 slices shown]
[im 3/55]
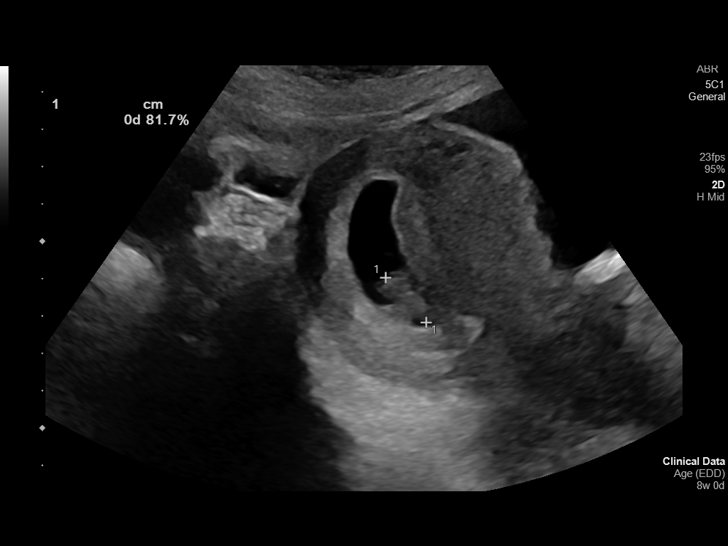
[im 7/55]
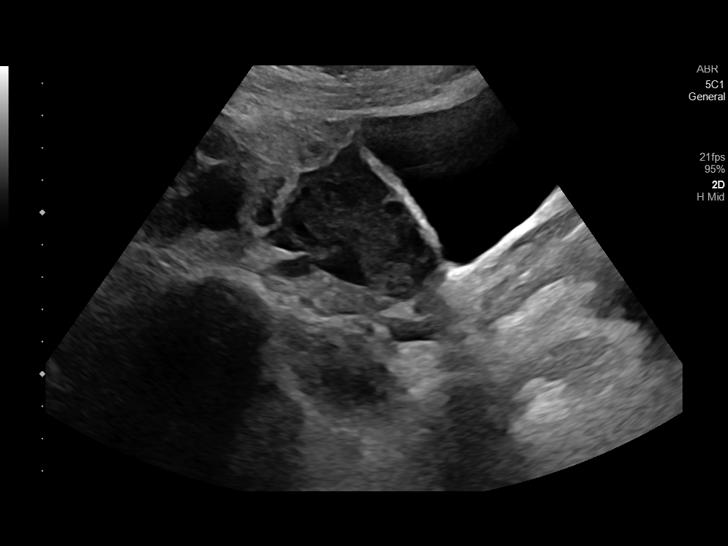
[im 11/55]
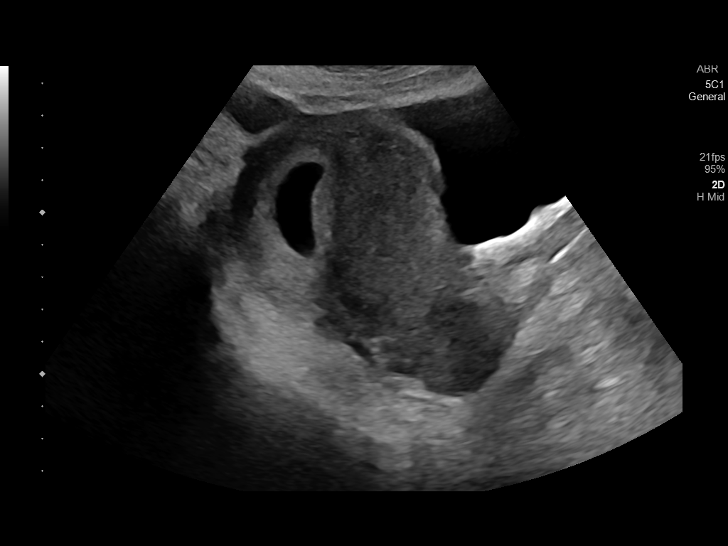
[im 15/55]
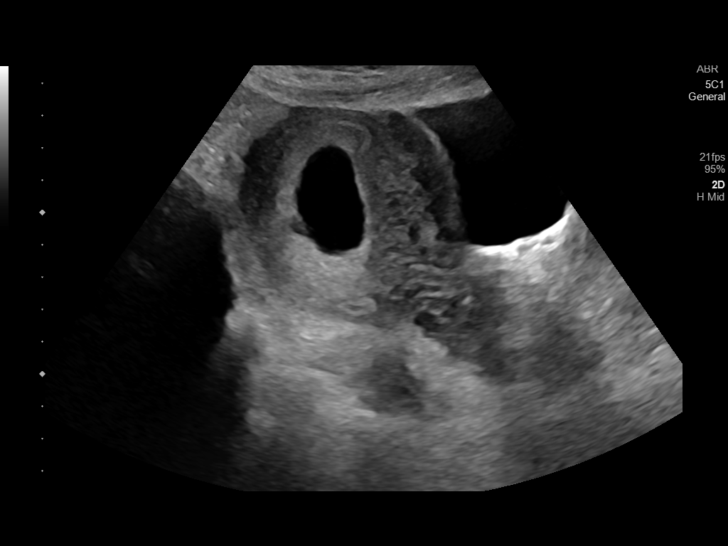
[im 19/55]
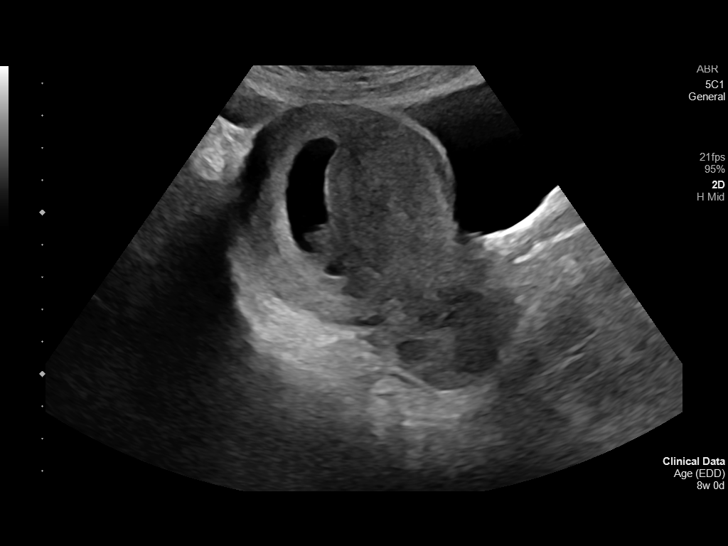
[im 23/55]
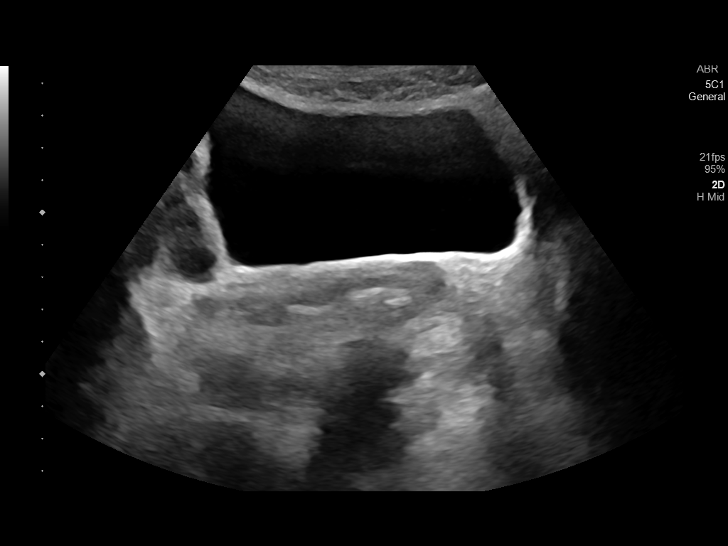
[im 27/55]
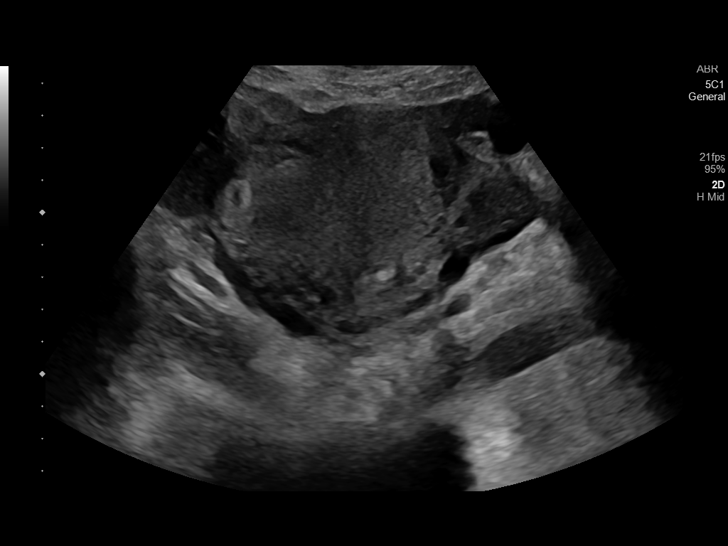
[im 31/55]
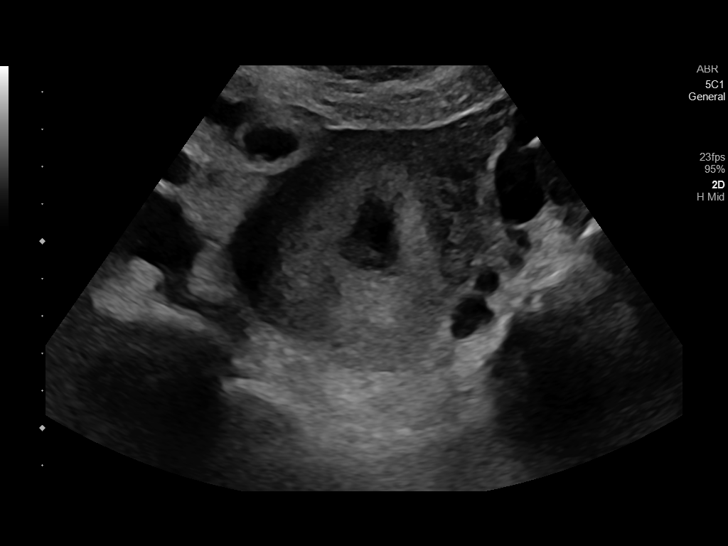
[im 35/55]
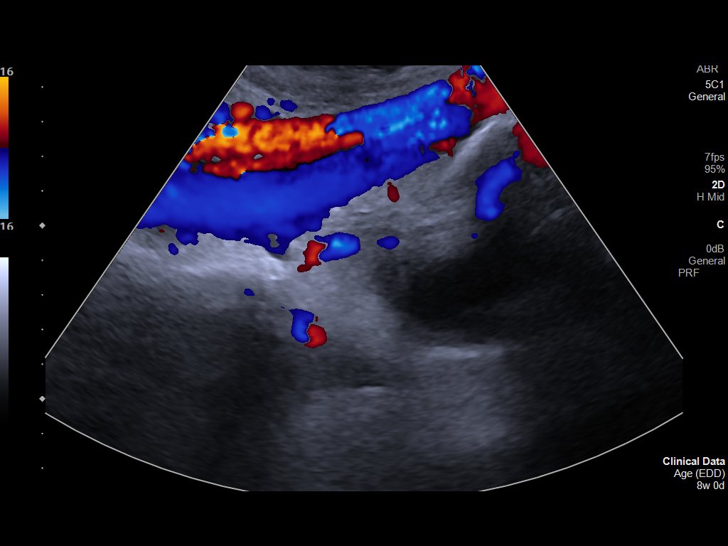
[im 39/55]
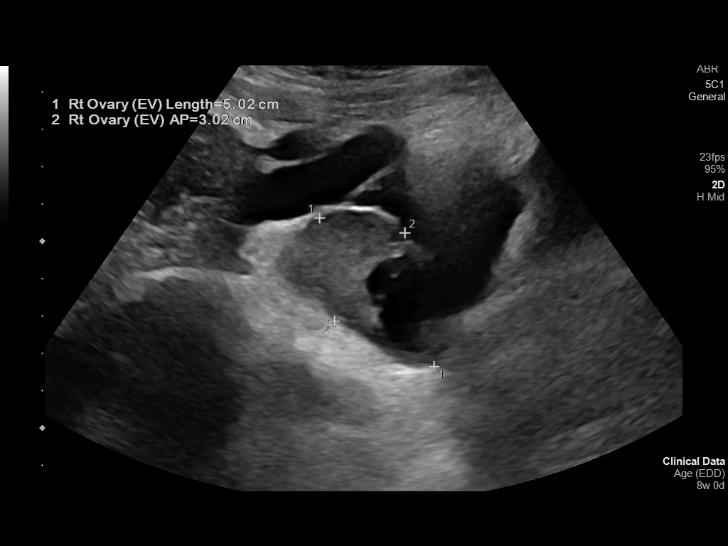
[im 43/55]
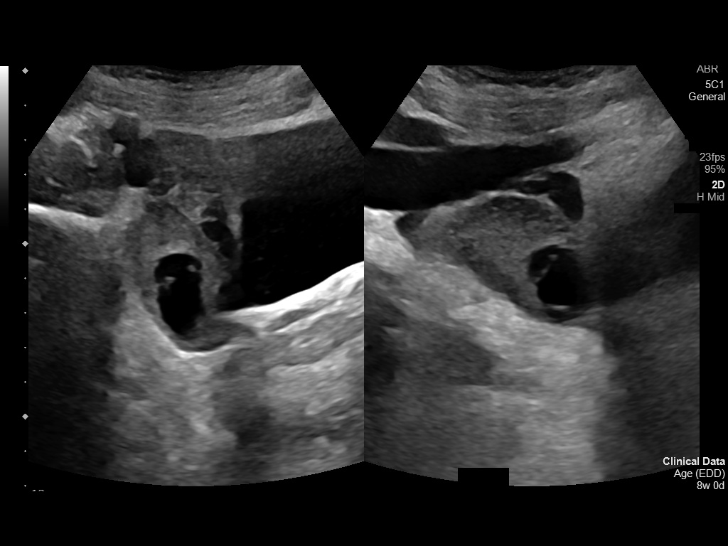
[im 47/55]
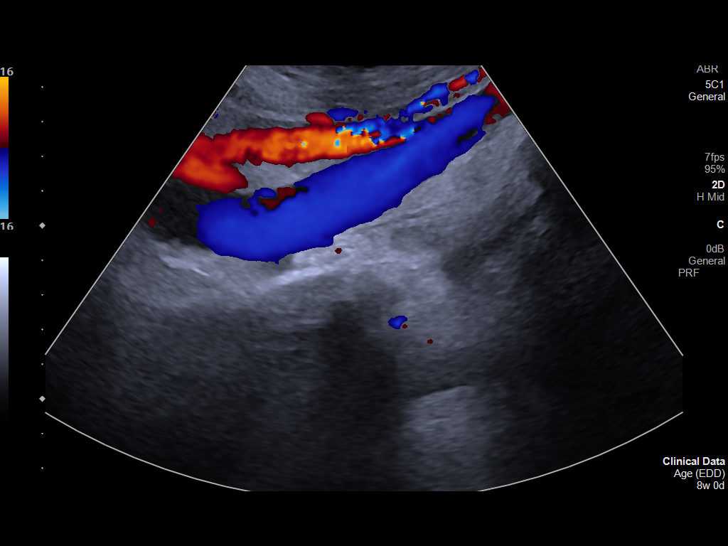
[im 51/55]
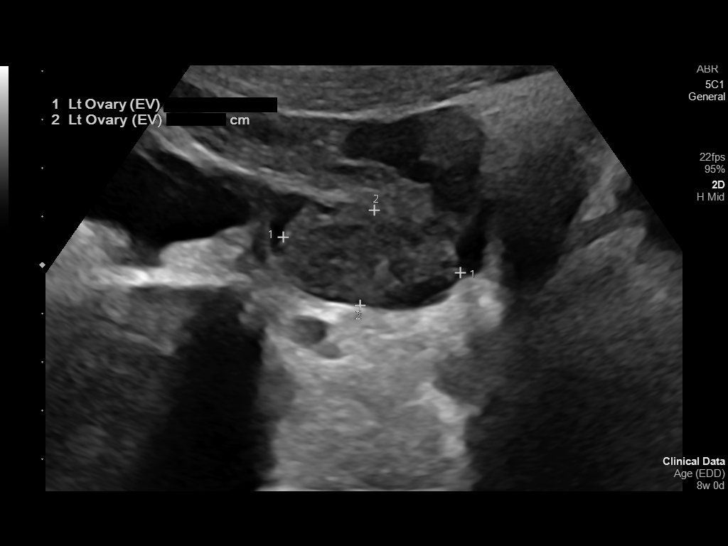
[im 55/55]
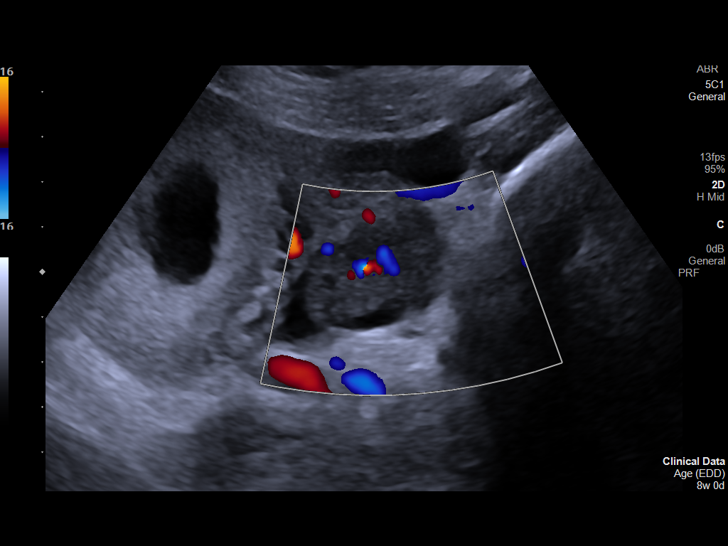

[14 of 28 positions shown; findings below may reference images not displayed]

FINDINGS: Intrauterine gestational sac: Single

Yolk sac:  Visualized.

Embryo:  Visualized.

Cardiac Activity: Visualized.

Heart Rate: 169 bpm

MSD:    mm    w     d

CRL:   16.0 mm   8 w 0 d                  US EDC: [DATE]

Subchorionic hemorrhage:  None visualized.

Maternal uterus/adnexae: There is a complicated cystic structure of
the right ovary measuring 2.5 x 2.2 x 1.9 cm. Normal left ovary.
IMPRESSION: 1. Single live intrauterine pregnancy corresponding to 8 weeks 0
days gestation, without complicating features.
2. Stable complicated cystic structure on the right ovary measuring
2.5 cm, query corpus luteum versus hemorrhagic cyst. While right
ovarian ectopic pregnancy is on the differential diagnosis, it is
statistically exceedingly rare. Clinical and imaging follow-up is
recommended.

## 2022-02-27 IMAGING — MR MR PELVIS W/O CM
6 of 8 series · 29 of 48 positions shown · non-contrast
Comparison: OB ultrasound dated [DATE]

CLINICAL DATA: Pregnant, right lower quadrant abdominal pain

EXAM:
MRI ABDOMEN AND PELVIS WITHOUT CONTRAST
TECHNIQUE: Multiplanar multisequence MR imaging of the abdomen and pelvis was
performed. No intravenous contrast was administered.

[Series 4: cor haste · coronal · 5.0mm · 1.25mm/px · 3 of 40 slices shown]
[im 1/40]
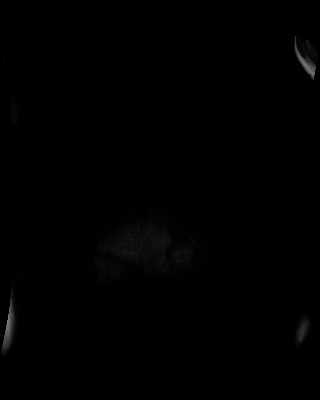
[im 20/40]
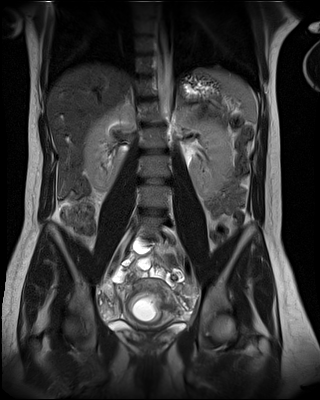
[im 40/40]
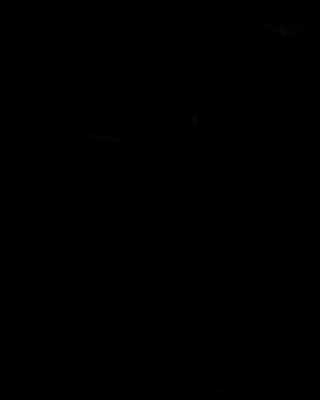

[Series 5: cor haste fs · coronal · 5.0mm · 1.25mm/px · 3 of 40 slices shown]
[im 1/40]
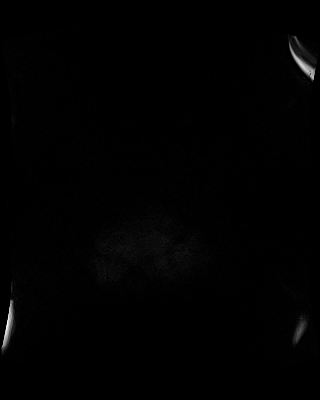
[im 20/40]
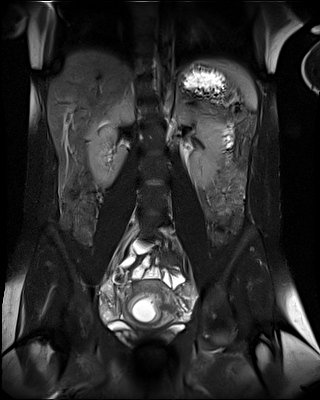
[im 40/40]
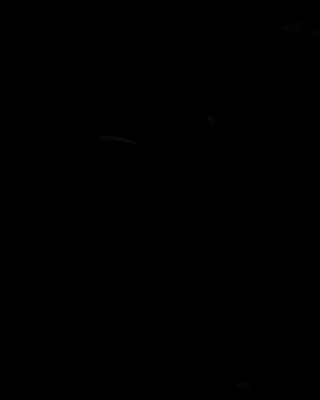

[Series 6: bSSFP · coronal · 5.0mm · 0.78mm/px · 3 of 40 slices shown (1 of 2)]
[im 1/40]
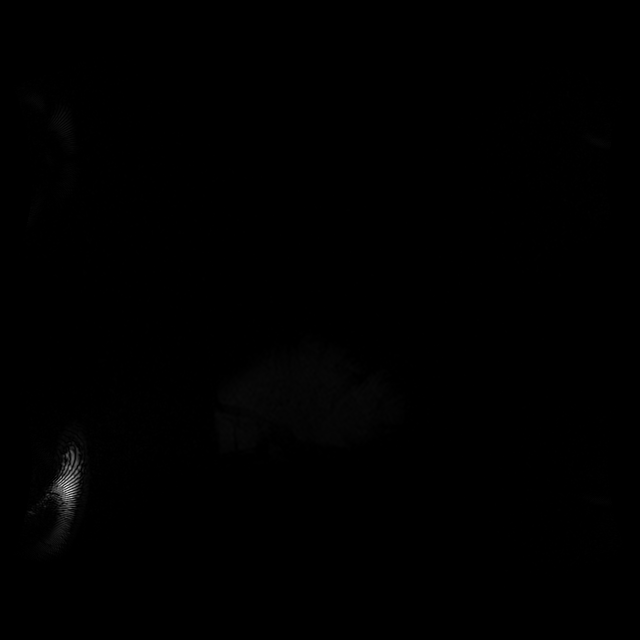
[im 20/40]
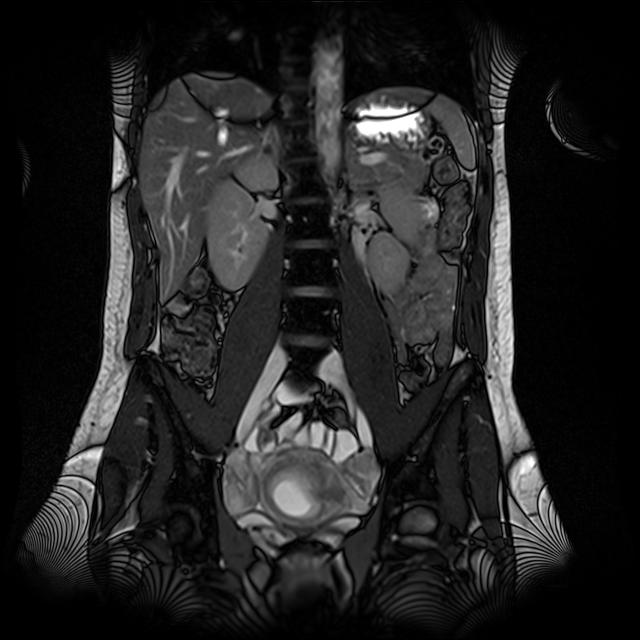
[im 40/40]
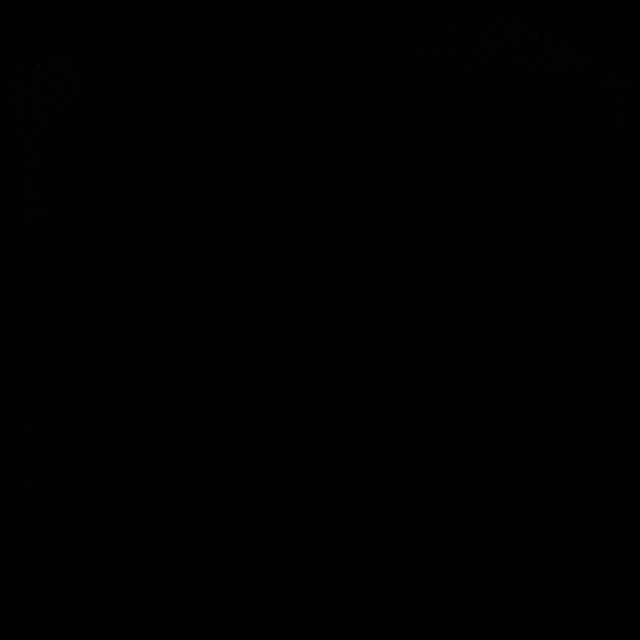

[Series 9: T2 · axial · 4.0mm · 1.19mm/px · z∈[-258,+255]mm · 9 of 108 slices shown (1 of 2)]
[im 1/108]
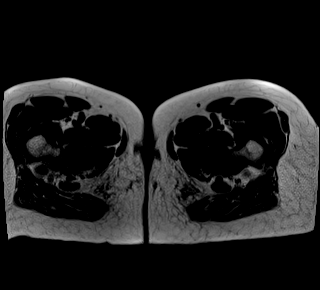
[im 14/108]
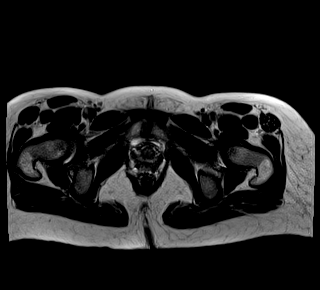
[im 27/108]
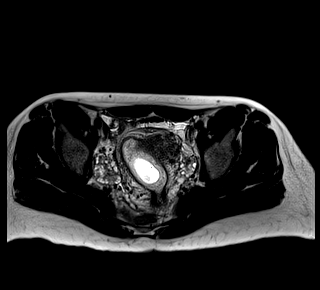
[im 41/108]
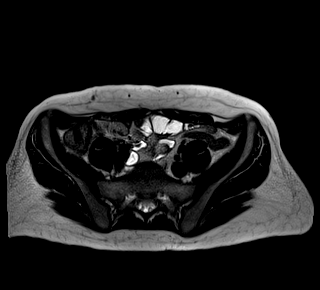
[im 54/108]
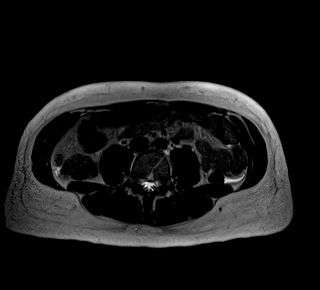
[im 67/108]
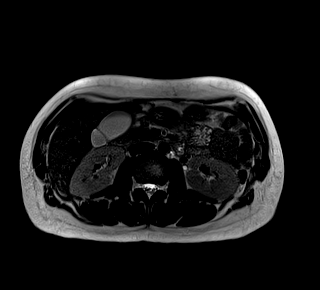
[im 81/108]
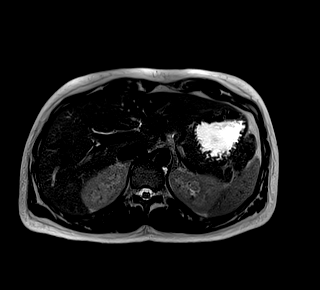
[im 94/108]
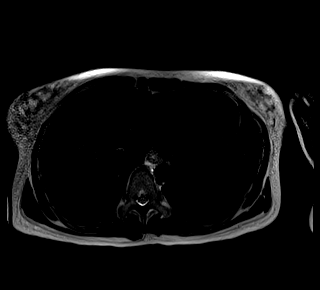
[im 108/108]
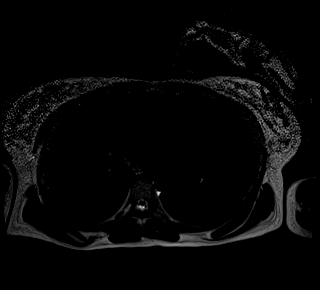

[Series 12: T2 · axial · 4.0mm · 1.19mm/px · z∈[-258,+255]mm · 8 of 108 slices shown (2 of 2)]
[im 1/108]
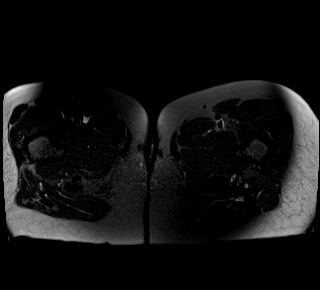
[im 14/108]
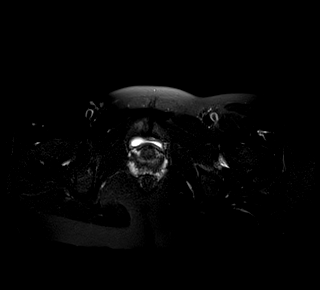
[im 27/108]
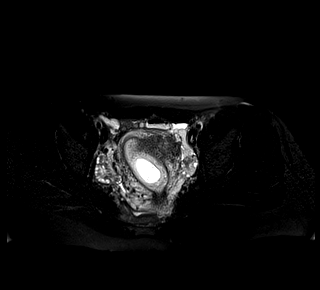
[im 41/108]
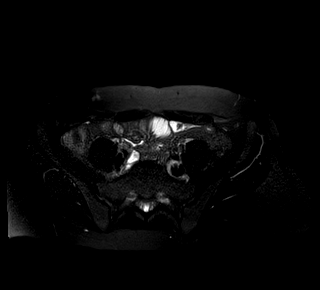
[im 67/108]
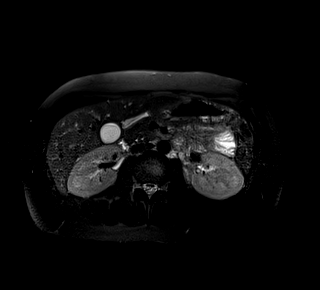
[im 81/108]
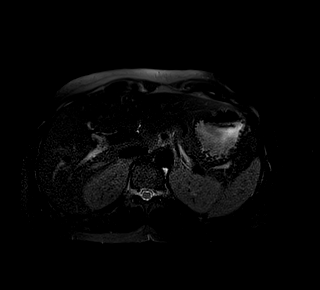
[im 94/108]
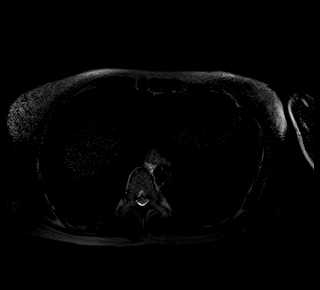
[im 108/108]
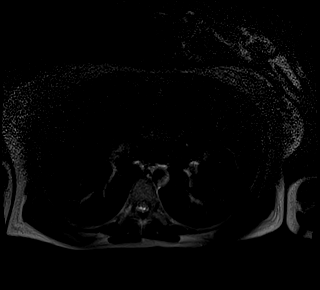

[Series 15: bSSFP · axial · 4.0mm · 0.59mm/px · z∈[-258,-134]mm · 3 of 108 slices shown (2 of 2)]
[im 1/108]
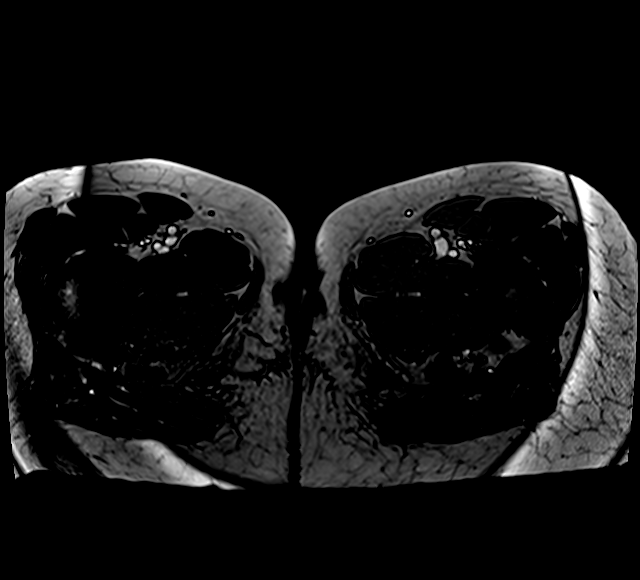
[im 14/108]
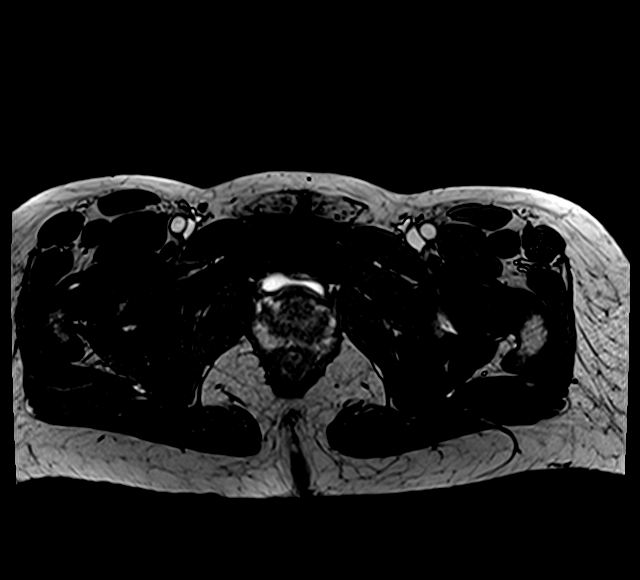
[im 27/108]
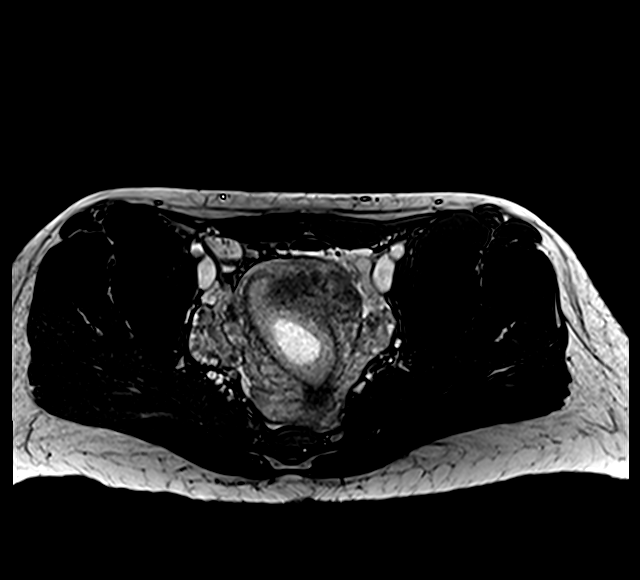

[29 of 48 positions shown; findings below may reference images not displayed]

FINDINGS: Lower chest: Lung bases are clear.

Hepatobiliary: Liver is within normal limits.

Gallbladder is unremarkable. No intrahepatic or extrahepatic ductal
dilatation.

Pancreas:  Within normal limits.

Spleen:  Within normal limits.

Adrenals/Urinary Tract:  Adrenal glands are within normal limits.

Kidneys are within normal limits.  No hydronephrosis.

Bladder is within normal limits.

Stomach/Bowel: Stomach is within normal limits.

No evidence of bowel obstruction.

Normal appendix (series 9/image 71).

No colonic wall thickening or inflammatory changes.

Vascular/Lymphatic:  No evidence of abdominal aortic aneurysm.

No suspicious abdominopelvic lymphadenopathy.

Reproductive: Early gravid uterus, better evaluated on recent OB
ultrasound.

Left ovary is within normal limits.

2.7 cm right ovarian corpus luteum (series 9/image 86),
physiologic/benign. No follow-up recommended.

Other:  Small volume pelvic ascites, likely physiologic.

Musculoskeletal: No focal osseous lesions.
IMPRESSION: Normal appendix.

Early gravid uterus, better evaluated on recent OB ultrasound.

2.7 cm right ovarian corpus luteum, physiologic/benign. No follow-up
recommended.

## 2022-02-27 IMAGING — MR MR ABDOMEN W/O CM
6 of 8 series · 29 of 48 positions shown · non-contrast
Comparison: OB ultrasound dated [DATE]

CLINICAL DATA: Pregnant, right lower quadrant abdominal pain

EXAM:
MRI ABDOMEN AND PELVIS WITHOUT CONTRAST
TECHNIQUE: Multiplanar multisequence MR imaging of the abdomen and pelvis was
performed. No intravenous contrast was administered.

[Series 4: cor haste · coronal · 5.0mm · 1.25mm/px · 3 of 40 slices shown]
[im 1/40]
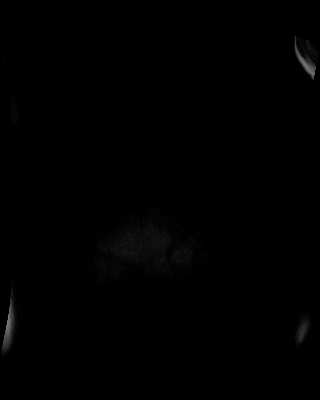
[im 20/40]
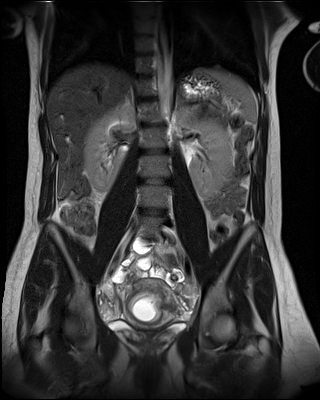
[im 40/40]
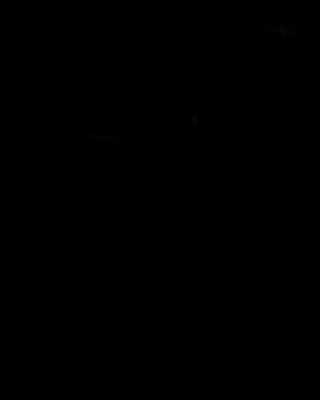

[Series 5: cor haste fs · coronal · 5.0mm · 1.25mm/px · 3 of 40 slices shown]
[im 1/40]
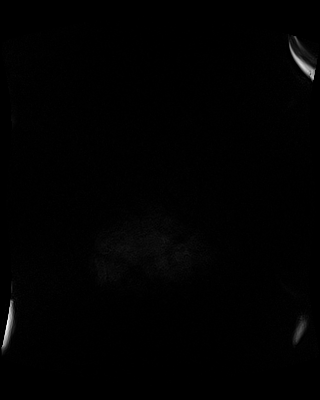
[im 20/40]
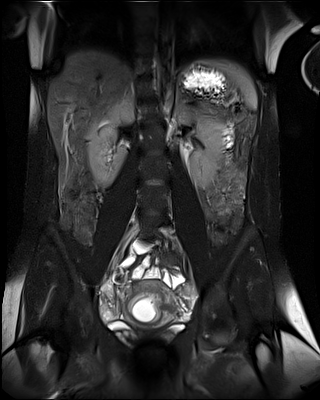
[im 40/40]
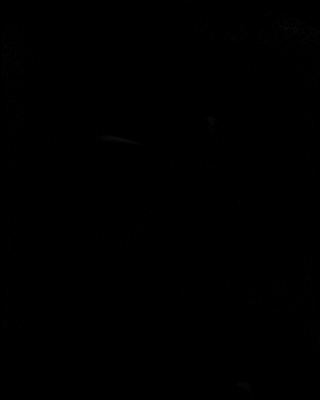

[Series 6: bSSFP · coronal · 5.0mm · 0.78mm/px · 3 of 40 slices shown (1 of 2)]
[im 1/40]
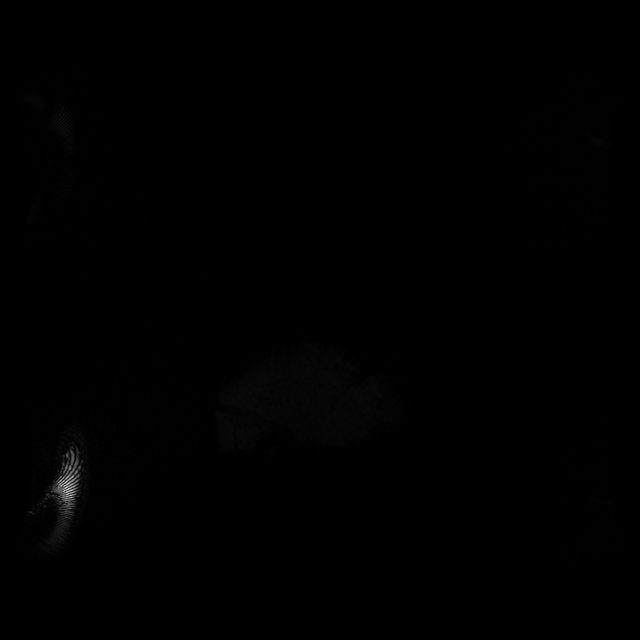
[im 20/40]
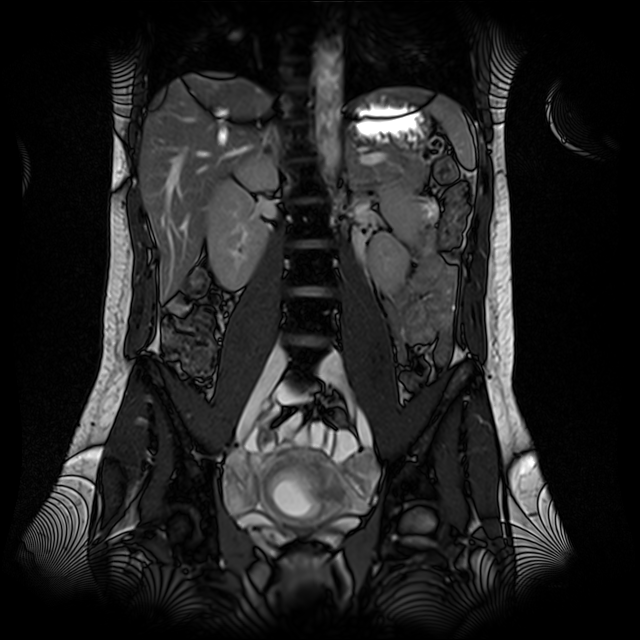
[im 40/40]
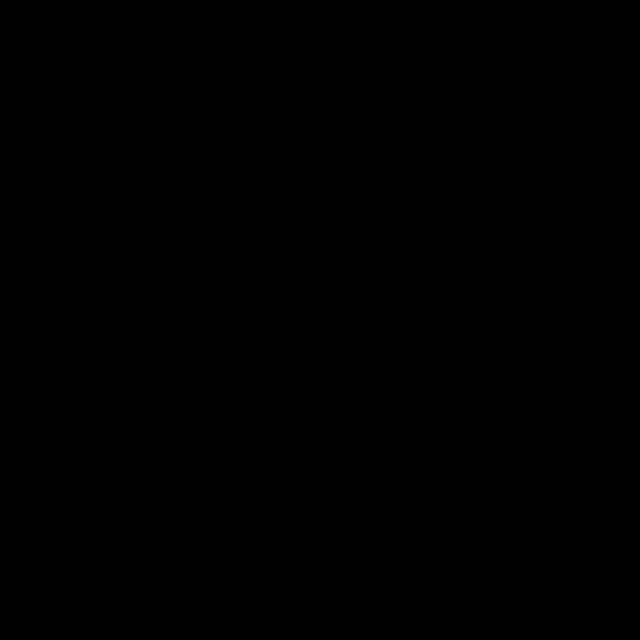

[Series 9: T2 · axial · 4.0mm · 1.19mm/px · z∈[-258,+255]mm · 9 of 108 slices shown (1 of 2)]
[im 1/108]
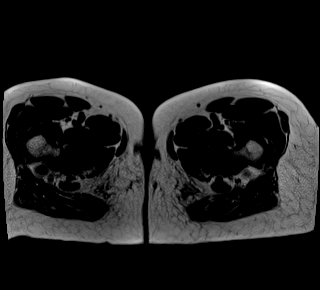
[im 14/108]
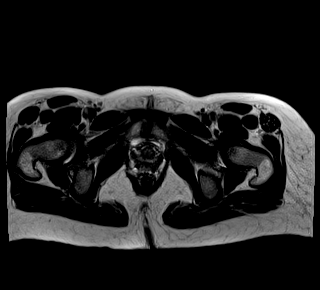
[im 27/108]
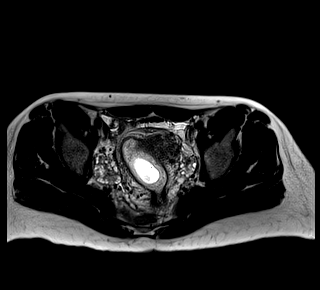
[im 41/108]
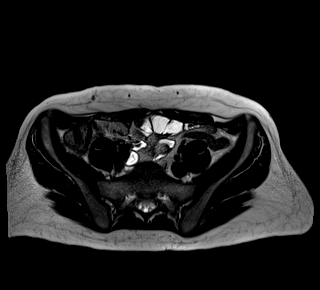
[im 54/108]
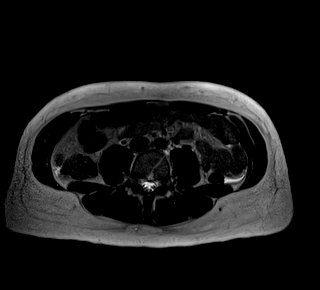
[im 67/108]
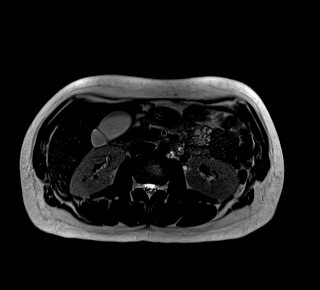
[im 81/108]
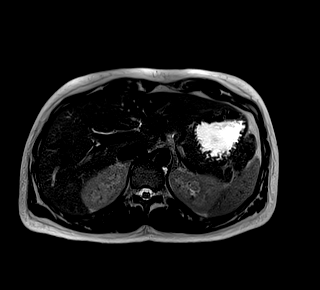
[im 94/108]
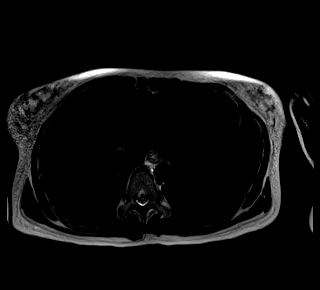
[im 108/108]
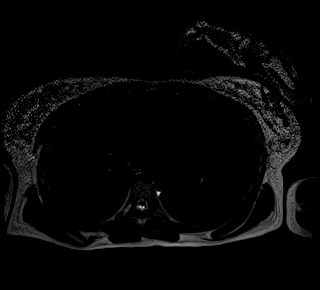

[Series 12: T2 · axial · 4.0mm · 1.19mm/px · z∈[-258,+255]mm · 8 of 108 slices shown (2 of 2)]
[im 1/108]
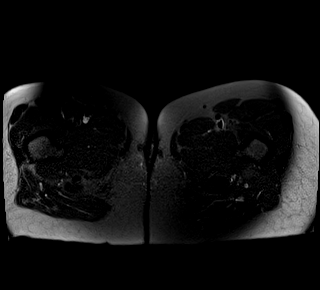
[im 14/108]
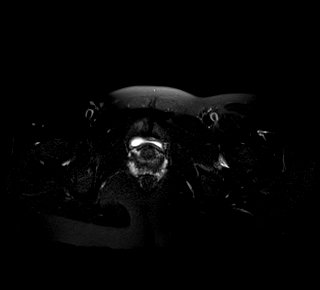
[im 27/108]
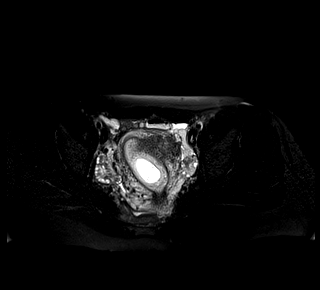
[im 41/108]
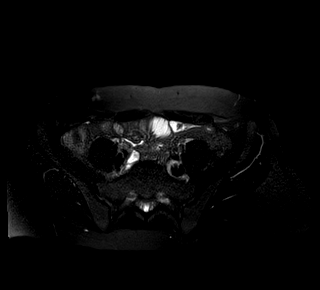
[im 67/108]
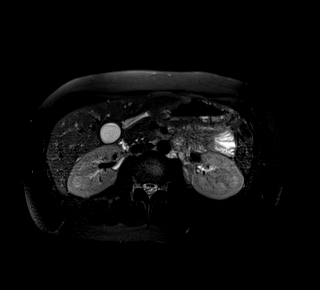
[im 81/108]
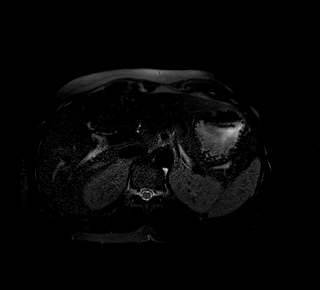
[im 94/108]
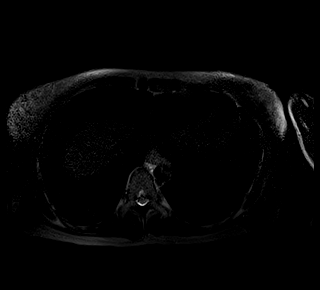
[im 108/108]
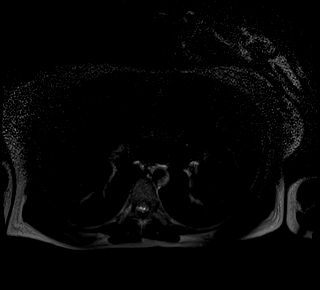

[Series 15: bSSFP · axial · 4.0mm · 0.59mm/px · z∈[-258,-134]mm · 3 of 108 slices shown (2 of 2)]
[im 1/108]
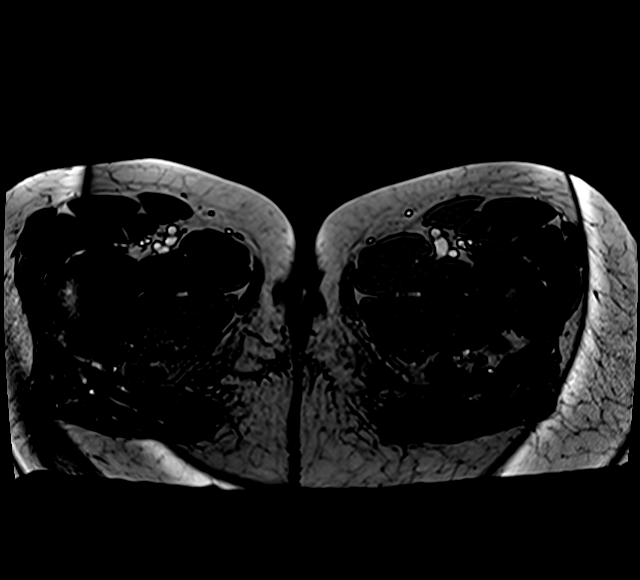
[im 14/108]
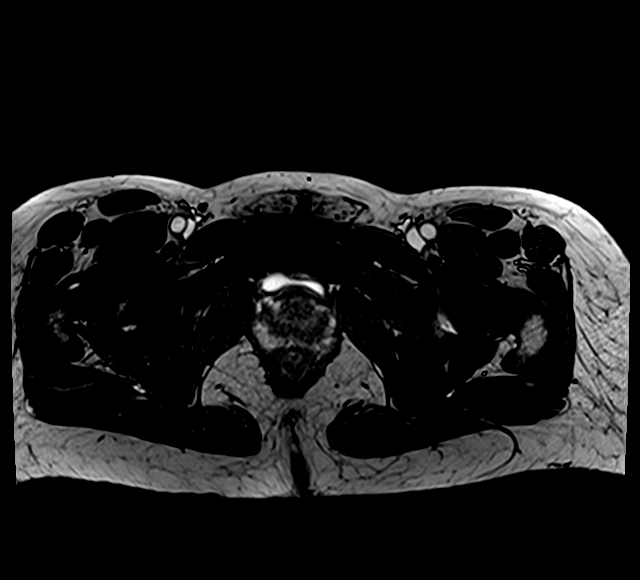
[im 27/108]
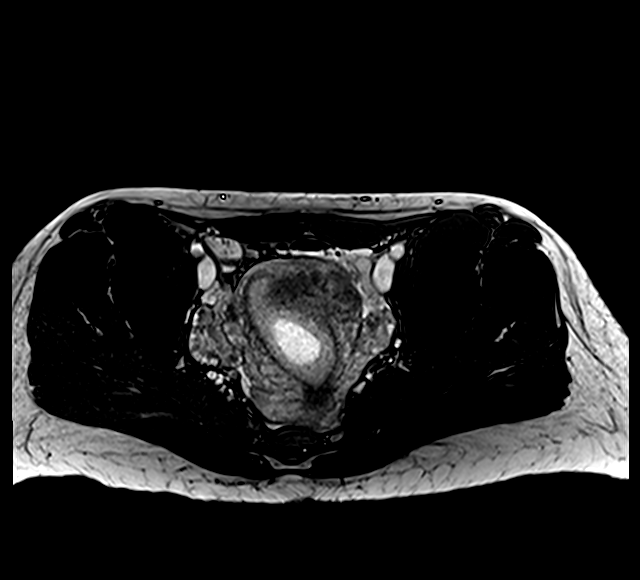

[29 of 48 positions shown; findings below may reference images not displayed]

FINDINGS: Lower chest: Lung bases are clear.

Hepatobiliary: Liver is within normal limits.

Gallbladder is unremarkable. No intrahepatic or extrahepatic ductal
dilatation.

Pancreas:  Within normal limits.

Spleen:  Within normal limits.

Adrenals/Urinary Tract:  Adrenal glands are within normal limits.

Kidneys are within normal limits.  No hydronephrosis.

Bladder is within normal limits.

Stomach/Bowel: Stomach is within normal limits.

No evidence of bowel obstruction.

Normal appendix (series 9/image 71).

No colonic wall thickening or inflammatory changes.

Vascular/Lymphatic:  No evidence of abdominal aortic aneurysm.

No suspicious abdominopelvic lymphadenopathy.

Reproductive: Early gravid uterus, better evaluated on recent OB
ultrasound.

Left ovary is within normal limits.

2.7 cm right ovarian corpus luteum (series 9/image 86),
physiologic/benign. No follow-up recommended.

Other:  Small volume pelvic ascites, likely physiologic.

Musculoskeletal: No focal osseous lesions.
IMPRESSION: Normal appendix.

Early gravid uterus, better evaluated on recent OB ultrasound.

2.7 cm right ovarian corpus luteum, physiologic/benign. No follow-up
recommended.

## 2022-02-27 MED ORDER — POTASSIUM CHLORIDE 10 MEQ/100ML IV SOLN
10.0000 meq | INTRAVENOUS | Status: AC
  Administered 2022-02-27 (×5): 10 meq via INTRAVENOUS
  Filled 2022-02-27 (×6): qty 100

## 2022-02-27 MED ORDER — METOCLOPRAMIDE HCL 10 MG PO TABS
10.0000 mg | ORAL_TABLET | Freq: Four times a day (QID) | ORAL | Status: DC | PRN
  Administered 2022-02-27 – 2022-02-28 (×4): 10 mg via ORAL
  Filled 2022-02-27 (×4): qty 1

## 2022-02-27 MED ORDER — POTASSIUM CHLORIDE CRYS ER 20 MEQ PO TBCR
20.0000 meq | EXTENDED_RELEASE_TABLET | Freq: Once | ORAL | Status: AC
  Administered 2022-02-27: 20 meq via ORAL
  Filled 2022-02-27: qty 1

## 2022-02-27 MED ORDER — PANTOPRAZOLE SODIUM 40 MG PO TBEC
40.0000 mg | DELAYED_RELEASE_TABLET | Freq: Two times a day (BID) | ORAL | Status: DC
  Administered 2022-02-27 – 2022-02-28 (×3): 40 mg via ORAL
  Filled 2022-02-27 (×3): qty 1

## 2022-02-27 MED ORDER — BOOST / RESOURCE BREEZE PO LIQD CUSTOM: 1.0000 | Freq: Three times a day (TID) | ORAL | Status: DC

## 2022-02-27 NOTE — Progress Notes (Addendum)
ANTEPARTUM PROGRESS NOTE ? ?Debra Murray is a 26 y.o. G3P2002 at [redacted]w[redacted]d who is admitted for hyperemesis.   ?Estimated Date of Delivery: 10/10/22 ? ?Length of Stay:  1 Days. Admitted 02/25/2022 ? ?Subjective: ?Patient resting in bed moaning and reports 10/10 pain in RLQ that just started while laying in bed.  ?Denies vaginal bleeding, no recent vomiting, but has excess saliva and nausea.  ? ? ?Vitals:  BP 132/77 (BP Location: Right Arm)   Pulse 69   Temp 98.4 ?F (36.9 ?C) (Oral)   Resp 20   Ht 6\' 1"  (1.854 m)   Wt 78.5 kg   LMP 01/02/2022   SpO2 96%   BMI 22.82 kg/m?  ?Physical Examination: ?General:   alert, cooperative, and appears stated age  ?Skin:  normal  ?Neurologic:    Alert & oriented x 3  ?Lungs:   clear to auscultation bilaterally  ?Heart:   regular rate and rhythm, S1, S2 normal, no murmur, click, rub or gallop  ?Abdomen:  abnormal findings:  moderate tenderness in the RLQ  ?Pelvis:  Exam deferred.  ? ? ?Results for orders placed or performed during the hospital encounter of 02/25/22 (from the past 48 hour(s))  ?CBC with Differential     Status: Abnormal  ? Collection Time: 02/25/22 11:44 PM  ?Result Value Ref Range  ? WBC 11.1 (H) 4.0 - 10.5 K/uL  ? RBC 3.48 (L) 3.87 - 5.11 MIL/uL  ? Hemoglobin 10.7 (L) 12.0 - 15.0 g/dL  ? HCT 31.1 (L) 36.0 - 46.0 %  ? MCV 89.4 80.0 - 100.0 fL  ? MCH 30.7 26.0 - 34.0 pg  ? MCHC 34.4 30.0 - 36.0 g/dL  ? RDW 12.0 11.5 - 15.5 %  ? Platelets 251 150 - 400 K/uL  ? nRBC 0.0 0.0 - 0.2 %  ? Neutrophils Relative % 73 %  ? Neutro Abs 8.0 (H) 1.7 - 7.7 K/uL  ? Lymphocytes Relative 19 %  ? Lymphs Abs 2.1 0.7 - 4.0 K/uL  ? Monocytes Relative 8 %  ? Monocytes Absolute 0.9 0.1 - 1.0 K/uL  ? Eosinophils Relative 0 %  ? Eosinophils Absolute 0.0 0.0 - 0.5 K/uL  ? Basophils Relative 0 %  ? Basophils Absolute 0.0 0.0 - 0.1 K/uL  ? Immature Granulocytes 0 %  ? Abs Immature Granulocytes 0.04 0.00 - 0.07 K/uL  ?  Comment: Performed at [facility], [address]  [place], [place] [address]  ?Comprehensive metabolic panel     Status: Abnormal  ? Collection Time: 02/25/22 11:44 PM  ?Result Value Ref Range  ? Sodium 137 135 - 145 mmol/L  ? Potassium 2.9 (L) 3.5 - 5.1 mmol/L  ? Chloride 106 98 - 111 mmol/L  ? CO2 24 22 - 32 mmol/L  ? Glucose, Bld 94 70 - 99 mg/dL  ?  Comment: Glucose reference range applies only to samples taken after fasting for at least 8 hours.  ? BUN 8 6 - 20 mg/dL  ? Creatinine, Ser 0.33 (L) 0.44 - 1.00 mg/dL  ? Calcium 8.8 (L) 8.9 - 10.3 mg/dL  ? Total Protein 6.6 6.5 - 8.1 g/dL  ? Albumin 4.0 3.5 - 5.0 g/dL  ? AST 36 15 - 41 U/L  ? ALT 75 (H) 0 - 44 U/L  ? Alkaline Phosphatase 30 (L) 38 - 126 U/L  ? Total Bilirubin 1.1 0.3 - 1.2 mg/dL  ? GFR, Estimated >60 >60 mL/min  ?  Comment: (NOTE) ?Calculated using the  CKD-EPI Creatinine Equation (2021) ?  ? Anion gap 7 5 - 15  ?  Comment: Performed at [facility], [address] [place], [place] [address]  ?Lipase, blood     Status: None  ? Collection Time: 02/25/22 11:44 PM  ?Result Value Ref Range  ? Lipase 27 11 - 51 U/L  ?  Comment: Performed at [facility] [address] [place], [place] [address]  ?Basic metabolic panel     Status: Abnormal  ? Collection Time: 02/26/22  6:02 AM  ?Result Value Ref Range  ? Sodium 137 135 - 145 mmol/L  ? Potassium 2.9 (L) 3.5 - 5.1 mmol/L  ? Chloride 106 98 - 111 mmol/L  ? CO2 25 22 - 32 mmol/L  ? Glucose, Bld 87 70 - 99 mg/dL  ?  Comment: Glucose reference range applies only to samples taken after fasting for at least 8 hours.  ? BUN 6 6 - 20 mg/dL  ? Creatinine, Ser 0.47 0.44 - 1.00 mg/dL  ? Calcium 8.2 (L) 8.9 - 10.3 mg/dL  ? GFR, Estimated >60 >60 mL/min  ?  Comment: (NOTE) ?Calculated using the CKD-EPI Creatinine Equation (2021) ?  ? Anion gap 6 5 - 15  ?  Comment: Performed at [facility], [address] [place], [place] [address]  ?CBC     Status: Abnormal  ? Collection Time: 02/26/22  6:02 AM  ?Result Value Ref Range  ? WBC 9.8 4.0 - 10.5  K/uL  ? RBC 3.29 (L) 3.87 - 5.11 MIL/uL  ? Hemoglobin 10.1 (L) 12.0 - 15.0 g/dL  ? HCT 29.5 (L) 36.0 - 46.0 %  ? MCV 89.7 80.0 - 100.0 fL  ? MCH 30.7 26.0 - 34.0 pg  ? MCHC 34.2 30.0 - 36.0 g/dL  ? RDW 11.8 11.5 - 15.5 %  ? Platelets 234 150 - 400 K/uL  ? nRBC 0.0 0.0 - 0.2 %  ?  Comment: Performed at [facility], [address], [place], [place] [address]  ?Magnesium     Status: None  ? Collection Time: 02/26/22  6:02 AM  ?Result Value Ref Range  ? Magnesium 2.1 1.7 - 2.4 mg/dL  ?  Comment: Performed at [facility], [address], [place], [place] [address]  ?Magnesium     Status: None  ? Collection Time: 02/27/22  5:00 AM  ?Result Value Ref Range  ? Magnesium 2.2 1.7 - 2.4 mg/dL  ?  Comment: Performed at [facility], [address], [place], [place] [address]  ?Phosphorus     Status: None  ? Collection Time: 02/27/22  5:00 AM  ?Result Value Ref Range  ? Phosphorus 3.3 2.5 - 4.6 mg/dL  ?  Comment: Performed at [facility], [address], [place],  [address]  ?Basic metabolic panel     Status: Abnormal  ? Collection Time: 02/27/22  5:00 AM  ?Result Value Ref Range  ? Sodium 135 135 - 145 mmol/L  ? Potassium 2.9 (L) 3.5 - 5.1 mmol/L  ? Chloride 105 98 - 111 mmol/L  ? CO2 23 22 - 32 mmol/L  ? Glucose, Bld 85 70 - 99 mg/dL  ?  Comment: Glucose reference range applies only to samples taken after fasting for at least 8 hours.  ? BUN <5 (L) 6 - 20 mg/dL  ? Creatinine, Ser 0.38 (L) 0.44 - 1.00 mg/dL  ? Calcium 8.6 (L) 8.9 - 10.3 mg/dL  ? GFR, Estimated >  60 >60 mL/min  ?  Comment: (NOTE) ?Calculated using the CKD-EPI Creatinine Equation (2021) ?  ? Anion gap 7 5 - 15  ?  Comment: Performed at [facility], [address] [place], [place] [address]  ? ? ?No results found. ? ?Current scheduled medications ? doxylamine (Sleep)  25 mg Oral BID  ? enoxaparin (LOVENOX) injection  40 mg Subcutaneous Q24H  ? feeding supplement  237 mL Oral TID BM  ? pantoprazole  40 mg  Oral BID  ? potassium chloride  20 mEq Oral Once  ? thiamine  100 mg Oral Daily  ? ? ?I have reviewed the patient's current medications. ? ?ASSESSMENT: Managed by Hospitalist/consulted by OB/GYN ?Patient Active Problem List  ? Diagnosis Date Noted  ? Hyperemesis gravidarum 02/25/2022  ? GI bleeding 02/25/2022  ? Hypokalemia 02/25/2022  ? ? ?PLAN: ? ?Continue routine antenatal care. ?- new onset RLQ pain, ordered OB US for eval of pregnancy and also Abdomen complete per Dr Posey Pronto (hospitalist).  ?- documented IUP per [facility] imaging completed on 02/19/22.  ? ? ? ?Francetta Found, CNM  ?Certified Nurse Midwife ?[facility] ?[facility]  ?  ?Patient had pelvic ultrasound today. I personally reviewed the images.  No significant interval change in appearance of cyst since 02/25/22. Blood flow appears to be present at right ovary.  While not 100% as an indicator for torsion or no torsion, is reassuring as an initial indicator.  Size of cyst is approximately the same.  Images not clear as to whether there is free fluid near the ovary and no comment on ultrasound report from ultrasound.  Discussed with primary team that we'll keep issues with ovaries in mind. However, we would recommend pursuing other etiologies for severe pain in the RLQ (appendicitis, for example).  After discussion with Dr. Posey Pronto of primary team, she will order a non-contrast MRI.  If MRI is normal and clinical suspicion becomes higher or pain does not resolve, may have to consider diagnostic laparoscopy. However, would try to avoid if at all safely possible.  Potassium  being aggressively replaced and this will help, if she needs to go to OR as hypokalemia can be an independent risk factor with anesthesia.   ? ?Prentice Docker, MD, FACOG ?[facility] ?02/27/2022 3:10 PM   ? ?

## 2022-02-27 NOTE — Progress Notes (Signed)
Pt. Has had 100 cc of clear liquid emesis. Pt. States she, "chugged" large amount of water prior to emesis. Zofran given per PRN order since Phenergan was given at 2125 and Reglan was last given on Day Shift at 1737. Pt. Tolerated P.O pills and crackers as well as Apple juice after I.V. administration of Zofran. Pt. Instructed to drink small frequent amounts of fluids and she v/o. Pt. Has voided two times so far this shift. ?

## 2022-02-27 NOTE — Progress Notes (Signed)
[facility]  - [place] at [facility] ? ? ?PATIENT NAME: Debra Murray   ? ?MR#:  [id number] ? ?DATE OF BIRTH:  1996/01/13 ? ?SUBJECTIVE:  ? ?patient was able to tolerate some fruit and juice along with yogurt. However vomited some back. Jillyn Hidden did not suit her well. Kept her meds down. Complains of right lower quadrant abdominal pain to obesity and M. She told me pain has been intermittent. No fever. No family at bedside. ? ?Spitting in the emesis bag. ? ?VITALS:  ?Blood pressure 132/77, pulse 69, temperature 98.4 ?F (36.9 ?C), temperature source Oral, resp. rate 20, height 6\' 1"  (1.854 m), weight 78.5 kg, last menstrual period 01/02/2022, SpO2 96 %. ? ?PHYSICAL EXAMINATION:  ? ?GENERAL:  26 y.o.-year-old patient lying in the bed with no acute distress.  ?LUNGS: Normal breath sounds bilaterally, no wheezing, rales, rhonchi.  ?CARDIOVASCULAR: S1, S2 normal. No murmurs, rubs, or gallops.  ?ABDOMEN: Soft, nontender, nondistended. Bowel sounds present.  ?EXTREMITIES: No  edema b/l.    ?NEUROLOGIC: nonfocal  patient is alert and awake ?SKIN: No obvious rash, lesion, or ulcer.  ? ?LABORATORY PANEL:  ?CBC ?Recent Labs  ?Lab 02/26/22 ?0602  ?WBC 9.8  ?HGB 10.1*  ?HCT 29.5*  ?PLT 234  ? ? ?Chemistries  ?Recent Labs  ?Lab 02/25/22 ?2344 02/26/22 ?0602 02/27/22 ?0500  ?NA 137   < > 135  ?K 2.9*   < > 2.9*  ?CL 106   < > 105  ?CO2 24   < > 23  ?GLUCOSE 94   < > 85  ?BUN 8   < > <5*  ?CREATININE 0.33*   < > 0.38*  ?CALCIUM 8.8*   < > 8.6*  ?MG  --    < > 2.2  ?AST 36  --   --   ?ALT 75*  --   --   ?ALKPHOS 30*  --   --   ?BILITOT 1.1  --   --   ? < > = values in this interval not displayed.  ? ?Cardiac Enzymes ?No results for input(s): TROPONINI in the last 168 hours. ?RADIOLOGY:  ?03/01/22 OB Comp Less 14 Wks ? ?Result Date: 02/27/2022 ?CLINICAL DATA:  Right lower quadrant pain since this morning. EXAM: OBSTETRIC <14 WK ULTRASOUND TECHNIQUE: Transabdominal ultrasound was performed for evaluation of the gestation as well  as the maternal uterus and adnexal regions. COMPARISON:  March 05, 2022 FINDINGS: Intrauterine gestational sac: Single Yolk sac:  Visualized. Embryo:  Visualized. Cardiac Activity: Visualized. Heart Rate: 169 bpm MSD:    mm    w     d CRL:   16.0 mm   8 w 0 d                  March 07, 2022 EDC: 10/09/2022 Subchorionic hemorrhage:  None visualized. Maternal uterus/adnexae: There is a complicated cystic structure of the right ovary measuring 2.5 x 2.2 x 1.9 cm. Normal left ovary. IMPRESSION: 1. Single live intrauterine pregnancy corresponding to 8 weeks 0 days gestation, without complicating features. 2. Stable complicated cystic structure on the right ovary measuring 2.5 cm, query corpus luteum versus hemorrhagic cyst. While right ovarian ectopic pregnancy is on the differential diagnosis, it is statistically exceedingly rare. Clinical and imaging follow-up is recommended. Electronically Signed   By: 14/12/2021 M.D.   On: 02/27/2022 12:29   ? ?Assessment and Plan ? ?[name] is a 26 y.o. female who is [redacted] weeks pregnant with medical history significant  for hyperemesis gravidarum, who presented to the ER with acute onset of intractable nausea and vomiting for the last 24 hours.  She was just hospitalized over 24 hours in [facility] for similar symptoms and thought she was getting better however her symptoms recurred after discharge.  Despite IV Reglan and Zofran in the ER she continued to have nausea and vomiting.  She denied any abdominal pain ?  ?Imaging: Right upper quad ultrasound revealed hepatic steatosis and was otherwise negative.  OB ultrasound revealed a single living IUP measuring 7 weeks and 6 days and thinly septated 2.7 cm right ovarian cyst likely hemorrhagic follicle.  ?  ?Hyperemesis gravidarum ?- pt recieved IV hydration with IV normal saline with added potassium chloride. ?- Antiemetics prn ?- PPI therapy  ?-- patient tolerating some clear liquid diet. ?-- Dietitian consult  appreciated. ? ?Hypokalemia ?-  IV KCL ? ?right lower quadrant abdominal pain intermittent ?-- ultrasound OB complete single live intrauterine pregnancy corresponding to 8 weeks 0 ?days gestation, without complicating features. ?2. Stable complicated cystic structure on the right ovary measuring ?2.5 cm, query corpus luteum versus hemorrhagic cyst. While right ?ovarian ectopic pregnancy is on the differential diagnosis, it is ?statistically exceedingly rare. Clinical and imaging follow-up is ?recommended. ?-- Case discussed with Dr. Jean Rosenthal OB/GYN. Discussed with radiology and recommended MRI abdomen/pelvis without contrast to look for other etiologies. Discussed with patient and she is in agreement for MRI. ? ? ? ? ?Procedures: ?Family communication : none ?Consults : OB/GYN ?CODE STATUS: full ?DVT Prophylaxis : ambulation ?Level of care: Telemetry Medical ?Status is: Inpatient ?Remains inpatient appropriate because: ongoing vomting ?  ? ?TOTAL TIME TAKING CARE OF THIS PATIENT: 30 minutes.  ?>50% time spent on counselling and coordination of care ? ?Note: This dictation was prepared with Dragon dictation along with smaller phrase technology. Any transcriptional errors that result from this process are unintentional. ? ?Enedina Finner M.D  ? ? ?[facility]  ? ?CC: ?Primary care physician; Pcp, No  ?

## 2022-02-27 NOTE — Consult Note (Addendum)
PHARMACY CONSULT NOTE - FOLLOW UP ? ?Pharmacy Consult for Electrolyte Monitoring and Replacement  ? ?Recent Labs: ?Potassium (mmol/L)  ?Date Value  ?02/27/2022 2.9 (L)  ? ?Magnesium (mg/dL)  ?Date Value  ?02/27/2022 2.2  ? ?Calcium (mg/dL)  ?Date Value  ?02/27/2022 8.6 (L)  ? ?Albumin (g/dL)  ?Date Value  ?02/25/2022 4.0  ? ?Phosphorus (mg/dL)  ?Date Value  ?02/27/2022 3.3  ? ?Sodium (mmol/L)  ?Date Value  ?02/27/2022 135  ? ? ? ?Assessment: ?26 y.o. female with medical history significant for asthma and hyperemesis gravidarum who is [redacted] weeks pregnant  who was admitted overnight and discharged yesterday for intractable nausea and vomiting with hyperemesis gravidarum.  ? ?Goal of Therapy:  ?WNL ? ?Plan:  ?KCL 10 mEq x 6 IV followed by Kcl 20 mEq x 1 in the PM.  ?F/u with AM labs.  ? ?Oswald Hillock ,PharmD, BCPS ?Clinical Pharmacist ?02/27/2022 10:39 AM ? ?

## 2022-02-27 NOTE — Progress Notes (Signed)
Patient has had intermittent nausea this shift (mostly in the early part of the night). Dry heaving but no emesis. 1 dose of PRN phenergan given at 2130 last night; no zofran needed thus far. Patient states she tolerated a cup of fruit for dinner with no vomiting. Patient tolerating PO fluids.   ? ?Patient's vitals have remained stable throughout the night. Patient was able to shower. No complaints of pain, LOF, or vaginal bleeding.  ?

## 2022-02-28 LAB — BASIC METABOLIC PANEL
Anion gap: 6 (ref 5–15)
BUN: 5 mg/dL — ABNORMAL LOW (ref 6–20)
CO2: 24 mmol/L (ref 22–32)
Calcium: 9.1 mg/dL (ref 8.9–10.3)
Chloride: 103 mmol/L (ref 98–111)
Creatinine, Ser: 0.46 mg/dL (ref 0.44–1.00)
GFR, Estimated: >60 mL/min (ref >60)
Glucose, Bld: 104 mg/dL — ABNORMAL HIGH (ref 70–99)
Potassium: 2.9 mmol/L — ABNORMAL LOW (ref 3.5–5.1)
Sodium: 133 mmol/L — ABNORMAL LOW (ref 135–145)

## 2022-02-28 LAB — URINE DRUG SCREEN, QUALITATIVE (ARMC ONLY)
Amphetamines, Ur Screen: NOT DETECTED
Barbiturates, Ur Screen: NOT DETECTED
Benzodiazepine, Ur Scrn: NOT DETECTED
Cannabinoid 50 Ng, Ur ~~LOC~~: POSITIVE — AB
Cocaine Metabolite,Ur ~~LOC~~: NOT DETECTED
MDMA (Ecstasy)Ur Screen: NOT DETECTED
Methadone Scn, Ur: NOT DETECTED
Opiate, Ur Screen: NOT DETECTED
Phencyclidine (PCP) Ur S: NOT DETECTED
Tricyclic, Ur Screen: NOT DETECTED

## 2022-02-28 LAB — POTASSIUM: Potassium: 3.5 mmol/L (ref 3.5–5.1)

## 2022-02-28 LAB — MAGNESIUM: Magnesium: 2.1 mg/dL (ref 1.7–2.4)

## 2022-02-28 MED ORDER — DOXYLAMINE SUCCINATE (SLEEP) 25 MG PO TABS: 25.0000 mg | ORAL_TABLET | Freq: Two times a day (BID) | ORAL | 0 refills | Status: DC

## 2022-02-28 MED ORDER — METHYLPREDNISOLONE 4 MG PO TABS: ORAL_TABLET | ORAL | 0 refills | Status: DC

## 2022-02-28 MED ORDER — POTASSIUM CHLORIDE CRYS ER 20 MEQ PO TBCR: 10.0000 meq | EXTENDED_RELEASE_TABLET | Freq: Every day | ORAL | 0 refills | Status: DC

## 2022-02-28 MED ORDER — METHYLPREDNISOLONE 4 MG PO TABS: 8.0000 mg | ORAL_TABLET | Freq: Every day | ORAL | Status: DC

## 2022-02-28 MED ORDER — PRENATAL VITAMIN 27-0.8 MG PO TABS: 1.0000 | ORAL_TABLET | Freq: Every day | ORAL | 1 refills | Status: AC

## 2022-02-28 MED ORDER — POTASSIUM CHLORIDE 20 MEQ PO PACK: 40.0000 meq | PACK | Freq: Once | ORAL | Status: DC

## 2022-02-28 MED ORDER — PROMETHAZINE HCL 12.5 MG RE SUPP: 12.5000 mg | RECTAL | 0 refills | Status: DC | PRN

## 2022-02-28 MED ORDER — METOCLOPRAMIDE HCL 10 MG PO TABS
10.0000 mg | ORAL_TABLET | Freq: Four times a day (QID) | ORAL | Status: DC
Start: 2022-02-28 — End: 2022-02-28
  Administered 2022-02-28: 10 mg via ORAL
  Filled 2022-02-28: qty 1

## 2022-02-28 MED ORDER — METHYLPREDNISOLONE 4 MG PO TABS: 4.0000 mg | ORAL_TABLET | Freq: Every day | ORAL | Status: DC

## 2022-02-28 MED ORDER — PANTOPRAZOLE SODIUM 40 MG PO TBEC: 40.0000 mg | DELAYED_RELEASE_TABLET | Freq: Every day | ORAL | 0 refills | Status: DC

## 2022-02-28 MED ORDER — PROCHLORPERAZINE MALEATE 10 MG PO TABS
10.0000 mg | ORAL_TABLET | Freq: Four times a day (QID) | ORAL | Status: DC | PRN
  Administered 2022-02-28: 10 mg via ORAL
  Filled 2022-02-28: qty 1

## 2022-02-28 MED ORDER — METHYLPREDNISOLONE SODIUM SUCC 40 MG IJ SOLR
48.0000 mg | Freq: Once | INTRAMUSCULAR | Status: AC
  Administered 2022-02-28: 48 mg via INTRAVENOUS
  Filled 2022-02-28 (×2): qty 2

## 2022-02-28 MED ORDER — METHYLPREDNISOLONE 4 MG PO TABS
4.0000 mg | ORAL_TABLET | Freq: Every day | ORAL | Status: DC
Start: 2022-03-12 — End: 2022-02-28

## 2022-02-28 MED ORDER — POTASSIUM CHLORIDE CRYS ER 20 MEQ PO TBCR
40.0000 meq | EXTENDED_RELEASE_TABLET | Freq: Once | ORAL | Status: AC
Start: 2022-02-28 — End: 2022-02-28
  Administered 2022-02-28: 40 meq via ORAL
  Filled 2022-02-28: qty 2

## 2022-02-28 MED ORDER — METHYLPREDNISOLONE 4 MG PO TABS: 16.0000 mg | ORAL_TABLET | Freq: Every day | ORAL | Status: DC

## 2022-02-28 MED ORDER — POTASSIUM CHLORIDE CRYS ER 20 MEQ PO TBCR
40.0000 meq | EXTENDED_RELEASE_TABLET | Freq: Once | ORAL | Status: AC
  Administered 2022-02-28: 40 meq via ORAL
  Filled 2022-02-28: qty 2

## 2022-02-28 MED ORDER — POTASSIUM CHLORIDE 10 MEQ/100ML IV SOLN
10.0000 meq | INTRAVENOUS | Status: DC
  Administered 2022-02-28: 10 meq via INTRAVENOUS
  Filled 2022-02-28 (×6): qty 100

## 2022-02-28 MED ORDER — METHYLPREDNISOLONE 4 MG PO TABS
8.0000 mg | ORAL_TABLET | Freq: Every day | ORAL | Status: DC
Start: 2022-03-03 — End: 2022-02-28

## 2022-02-28 MED ORDER — CALCIUM CARBONATE ANTACID 500 MG PO CHEW
1.0000 | CHEWABLE_TABLET | Freq: Three times a day (TID) | ORAL | Status: DC | PRN
  Administered 2022-02-28: 200 mg via ORAL

## 2022-02-28 MED ORDER — METHYLPREDNISOLONE 4 MG PO TABS
16.0000 mg | ORAL_TABLET | Freq: Every day | ORAL | Status: DC
Start: 2022-03-01 — End: 2022-02-28

## 2022-02-28 MED ORDER — PROCHLORPERAZINE MALEATE 10 MG PO TABS
10.0000 mg | ORAL_TABLET | Freq: Four times a day (QID) | ORAL | 0 refills | Status: DC | PRN
Start: 2022-02-28 — End: 2022-03-07

## 2022-02-28 MED ORDER — PREDNISONE 10 MG PO TABS: ORAL_TABLET | ORAL | 0 refills | Status: DC

## 2022-02-28 NOTE — Progress Notes (Signed)
ANTEPARTUM PROGRESS NOTE ? ?Debra Murray is a 26 y.o. G3P2002 at [redacted]w[redacted]d who is admitted for hyperemesis.   ?Estimated Date of Delivery: 10/10/22 ? ?Length of Stay:  2 Days. Admitted 02/25/2022 ? ?Subjective: ?Reports pain from yesterday has resolved, feels it may have been related to retching. Reports able to tolerate breakfast without nausea, has not vomited since last night. Feeling much better today.  ? ? ?Vitals:  BP 128/83 (BP Location: Left Arm)   Pulse 86   Temp 98.6 ?F (37 ?C) (Oral)   Resp 20   Ht 6\' 1"  (1.854 m)   Wt 76.4 kg   LMP 01/02/2022   SpO2 99%   BMI 22.22 kg/m?  ?Physical Examination: ?General:   alert, cooperative, and appears stated age  ?Skin:  normal  ?Neurologic:    Alert & oriented x 3  ?Lungs:   clear to auscultation bilaterally  ?Heart:   regular rate and rhythm, S1, S2 normal, no murmur, click, rub or gallop  ?Abdomen:  soft, non-tender; bowel sounds normal; no masses,  no organomegaly  ?Pelvis:  Exam deferred.  ? ? ?Results for orders placed or performed during the hospital encounter of 02/25/22 (from the past 48 hour(s))  ?Magnesium     Status: None  ? Collection Time: 02/27/22  5:00 AM  ?Result Value Ref Range  ? Magnesium 2.2 1.7 - 2.4 mg/dL  ?  Comment: Performed at [facility], [address], [place], [place]  ?Phosphorus     Status: None  ? Collection Time: 02/27/22  5:00 AM  ?Result Value Ref Range  ? Phosphorus 3.3 2.5 - 4.6 mg/dL  ?  Comment: Performed at [facility], [address], [place], [place]  ?Basic metabolic panel     Status: Abnormal  ? Collection Time: 02/27/22  5:00 AM  ?Result Value Ref Range  ? Sodium 135 135 - 145 mmol/L  ? Potassium 2.9 (L) 3.5 - 5.1 mmol/L  ? Chloride 105 98 - 111 mmol/L  ? CO2 23 22 - 32 mmol/L  ? Glucose, Bld 85 70 - 99 mg/dL  ?  Comment: Glucose reference range applies only to samples taken after fasting for at least 8 hours.  ? BUN <5 (L) 6 - 20 mg/dL  ? Creatinine, Ser 0.38 (L) 0.44 - 1.00  mg/dL  ? Calcium 8.6 (L) 8.9 - 10.3 mg/dL  ? GFR, Estimated >60 >60 mL/min  ?  Comment: (NOTE) ?Calculated using the CKD-EPI Creatinine Equation (2021) ?  ? Anion gap 7 5 - 15  ?  Comment: Performed at [facility], [address], [place], [place]  ?Basic metabolic panel     Status: Abnormal  ? Collection Time: 02/28/22  5:31 AM  ?Result Value Ref Range  ? Sodium 133 (L) 135 - 145 mmol/L  ? Potassium 2.9 (L) 3.5 - 5.1 mmol/L  ? Chloride 103 98 - 111 mmol/L  ? CO2 24 22 - 32 mmol/L  ? Glucose, Bld 104 (H) 70 - 99 mg/dL  ?  Comment: Glucose reference range applies only to samples taken after fasting for at least 8 hours.  ? BUN 5 (L) 6 - 20 mg/dL  ? Creatinine, Ser 0.46 0.44 - 1.00 mg/dL  ? Calcium 9.1 8.9 - 10.3 mg/dL  ? GFR, Estimated >60 >60 mL/min  ?  Comment: (NOTE) ?Calculated using the CKD-EPI Creatinine Equation (2021) ?  ? Anion gap 6 5 - 15  ?  Comment: Performed  at [facility], [address] [place], [place] [address]  ?Magnesium     Status: None  ? Collection Time: 02/28/22  6:31 AM  ?Result Value Ref Range  ? Magnesium 2.1 1.7 - 2.4 mg/dL  ?  Comment: Performed at [facility], [address] [place], [place] [address]  ?Urine Drug Screen, Qualitative ([facility] only)     Status: Abnormal  ? Collection Time: 02/28/22 10:40 AM  ?Result Value Ref Range  ? Tricyclic, Ur Screen NONE DETECTED NONE DETECTED  ? Amphetamines, Ur Screen NONE DETECTED NONE DETECTED  ? MDMA (Ecstasy)Ur Screen NONE DETECTED NONE DETECTED  ? Cocaine Metabolite,Ur [place] NONE DETECTED NONE DETECTED  ? Opiate, Ur Screen NONE DETECTED NONE DETECTED  ? Phencyclidine (PCP) Ur S NONE DETECTED NONE DETECTED  ? Cannabinoid 50 Ng, Ur Lupus POSITIVE (A) NONE DETECTED  ? Barbiturates, Ur Screen NONE DETECTED NONE DETECTED  ? Benzodiazepine, Ur Scrn NONE DETECTED NONE DETECTED  ? Methadone Scn, Ur NONE DETECTED NONE DETECTED  ?  Comment: (NOTE) ?Tricyclics + metabolites, urine    Cutoff 1000 ng/mL ?Amphetamines +  metabolites, urine  Cutoff 1000 ng/mL ?MDMA (Ecstasy), urine              Cutoff 500 ng/mL ?Cocaine Metabolite, urine          Cutoff 300 ng/mL ?Opiate + metabolites, urine        Cutoff 300 ng/mL ?Phencyclidine (PCP), urine         Cutoff 25 ng/mL ?Cannabinoid, urine                 Cutoff 50 ng/mL ?Barbiturates + metabolites, urine  Cutoff 200 ng/mL ?Benzodiazepine, urine              Cutoff 200 ng/mL ?Methadone, urine                   Cutoff 300 ng/mL ? ?The urine drug screen provides only a preliminary, unconfirmed ?analytical test result and should not be used for non-medical ?purposes. Clinical consideration and professional judgment should ?be applied to any positive drug screen result due to possible ?interfering substances. A more specific alternate chemical method ?must be used in order to obtain a confirmed analytical result. ?Gas chromatography / mass spectrometry (GC/MS) is the preferred ?confirm atory method. ?Performed at [facility], [address] [place], ?[place] [address] ?  ? ? ?MR PELVIS WO CONTRAST ? ?Result Date: 02/27/2022 ?CLINICAL DATA:  Pregnant, right lower quadrant abdominal pain EXAM: MRI ABDOMEN AND PELVIS WITHOUT CONTRAST TECHNIQUE: Multiplanar multisequence MR imaging of the abdomen and pelvis was performed. No intravenous contrast was administered. COMPARISON:  OB ultrasound dated 02/27/2022 FINDINGS: Lower chest: Lung bases are clear. Hepatobiliary: Liver is within normal limits. Gallbladder is unremarkable. No intrahepatic or extrahepatic ductal dilatation. Pancreas:  Within normal limits. Spleen:  Within normal limits. Adrenals/Urinary Tract:  Adrenal glands are within normal limits. Kidneys are within normal limits.  No hydronephrosis. Bladder is within normal limits. Stomach/Bowel: Stomach is within normal limits. No evidence of bowel obstruction. Normal appendix (series 9/image 71). No colonic wall thickening or inflammatory changes. Vascular/Lymphatic:  No  evidence of abdominal aortic aneurysm. No suspicious abdominopelvic lymphadenopathy. Reproductive: Early gravid uterus, better evaluated on recent OB ultrasound. Left ovary is within normal limits. 2.7 cm right ovarian corpus luteum (series 9/image 86), physiologic/benign. No follow-up recommended. Other:  Small volume pelvic ascites, likely physiologic. Musculoskeletal: No focal osseous lesions. IMPRESSION: Normal appendix. Early gravid uterus,  better evaluated on recent OB ultrasound. 2.7 cm right ovarian corpus luteum, physiologic/benign. No follow-up recommended. Electronically Signed   By: Charline Bills M.D.   On: 02/27/2022 19:09  ? ?MR ABDOMEN WO CONTRAST ? ?Result Date: 02/27/2022 ?CLINICAL DATA:  Pregnant, right lower quadrant abdominal pain EXAM: MRI ABDOMEN AND PELVIS WITHOUT CONTRAST TECHNIQUE: Multiplanar multisequence MR imaging of the abdomen and pelvis was performed. No intravenous contrast was administered. COMPARISON:  OB ultrasound dated 02/27/2022 FINDINGS: Lower chest: Lung bases are clear. Hepatobiliary: Liver is within normal limits. Gallbladder is unremarkable. No intrahepatic or extrahepatic ductal dilatation. Pancreas:  Within normal limits. Spleen:  Within normal limits. Adrenals/Urinary Tract:  Adrenal glands are within normal limits. Kidneys are within normal limits.  No hydronephrosis. Bladder is within normal limits. Stomach/Bowel: Stomach is within normal limits. No evidence of bowel obstruction. Normal appendix (series 9/image 71). No colonic wall thickening or inflammatory changes. Vascular/Lymphatic:  No evidence of abdominal aortic aneurysm. No suspicious abdominopelvic lymphadenopathy. Reproductive: Early gravid uterus, better evaluated on recent OB ultrasound. Left ovary is within normal limits. 2.7 cm right ovarian corpus luteum (series 9/image 86), physiologic/benign. No follow-up recommended. Other:  Small volume pelvic ascites, likely physiologic. Musculoskeletal: No  focal osseous lesions. IMPRESSION: Normal appendix. Early gravid uterus, better evaluated on recent OB ultrasound. 2.7 cm right ovarian corpus luteum, physiologic/benign. No follow-up recommended. Electronically Signed

## 2022-02-28 NOTE — Discharge Summary (Signed)
?Physician Discharge Summary ?  ?Patient: Debra Murray MRN: [id number] DOB: [birth year]  ?Admit date:     02/25/2022  ?Discharge date: 02/28/22  ?Discharge Physician: Fritzi Mandes  ? ?PCP: Pcp, No  ? ?Recommendations at discharge:  ? ?follow-up [facility] on your scheduled report ? ?Discharge Diagnoses: ?Intractable nausea vomiting/hyperemesis gravidarum ?Hypokalemia ?Cannabis abuse ?[redacted] weeks pregnant ? ?Hospital Course: ?Debra Murray is a 26 y.o. female who is [redacted] weeks pregnant with medical history significant for hyperemesis gravidarum, who presented to the ER with acute onset of intractable nausea and vomiting for the last 24 hours.  She was just hospitalized over 24 hours in [facility] for similar symptoms and thought she was getting better however her symptoms recurred after discharge.  Despite IV Reglan and Zofran in the ER she continued to have nausea and vomiting.  She denied any abdominal pain ?  ?Imaging: Right upper quad ultrasound revealed hepatic steatosis and was otherwise negative.  OB ultrasound revealed a single living IUP measuring 7 weeks and 6 days and thinly septated 2.7 cm right ovarian cyst likely hemorrhagic follicle.  ?  ?Hyperemesis gravidarum ?UDS positive for Cannabis ?- pt recieved IV hydration with IV normal saline with added potassium chloride. ?- Antiemetics prn ?- PPI therapy  ?-- patient tolerating some po diet ?-- Dietitian consult appreciated. ?--prednisone taper per OB-Gyn rec ?--pt advised abstain from drug use ?  ?Hypokalemia ?-  IV KCL and po KCL ?-k 3.5 ?  ?right lower quadrant abdominal pain intermittent ?-- ultrasound OB complete single live intrauterine pregnancy corresponding to 8 weeks 0 ?days gestation, without complicating features. ?2. Stable complicated cystic structure on the right ovary measuring ?2.5 cm, query corpus luteum versus hemorrhagic cyst. While right ?ovarian ectopic pregnancy is on the differential diagnosis, it is ?statistically exceedingly rare. Clinical  and imaging follow-up is ?recommended. ?-- Case discussed with Dr. Glennon Mac OB/GYN. Discussed with radiology and recommended MRI abdomen/pelvis without contrast to look for other etiologies. Discussed with patient and she is in agreement for MRI. ?--MRI negative ? ?Overall improving slowly. Pt requesting to go home now that K is improved and she is keeping food down. She is advised to follow-up with [facility] OB[facility]N ? ? ?  ? ? ?Consultants: Ob-gyn ?Procedures performed: none  ?Disposition: Home ?Diet recommendation:  ?Regular diet ?DISCHARGE MEDICATION: ?Allergies as of 02/28/2022   ?No Known Allergies ?  ? ?  ?Medication List  ?  ? ?STOP taking these medications   ? ?promethazine 25 MG suppository ?Commonly known as: PHENERGAN ?  ?promethazine 25 MG tablet ?Commonly known as: PHENERGAN ?  ? ?  ? ?TAKE these medications   ? ?doxylamine (Sleep) 25 MG tablet ?Commonly known as: UNISOM ?Take 1 tablet (25 mg total) by mouth 2 (two) times daily. ?  ?ondansetron 4 MG disintegrating tablet ?Commonly known as: ZOFRAN-ODT ?Take 4 mg by mouth every 8 (eight) hours as needed. ?  ?pantoprazole 40 MG tablet ?Commonly known as: PROTONIX ?Take 1 tablet (40 mg total) by mouth daily. ?  ?potassium chloride SA 20 MEQ tablet ?Commonly known as: KLOR-CON M ?Take 0.5 tablets (10 mEq total) by mouth daily. ?  ?predniSONE 10 MG tablet ?Commonly known as: DELTASONE ?40 mg oral per day for one day, followed by 20 mg per day for three days, followed by 10 mg per day for three days, and then 5 mg per day for seven days. ?  ?Prenatal Vitamin 27-0.8 MG Tabs ?Take 1 tablet by mouth daily. ?  ?prochlorperazine 10  MG tablet ?Commonly known as: COMPAZINE ?Take 1 tablet (10 mg total) by mouth every 6 (six) hours as needed for nausea or vomiting. ?  ? ?  ? ? Follow-up Information   ? ? Diona Fanti, CNM. Schedule an appointment as soon as possible for a visit in 1 week(s).   ?Specialties: Certified Nurse Midwife, Obstetrics and Gynecology,  Radiology ?Contact information: ?[address] ?[address] ?[place] [address] ?[phone] ? ? ?  ?  ? ?  ?  ? ?  ? ?Discharge Exam: ?Filed Weights  ? 02/26/22 1228 02/27/22 0415 02/28/22 0300  ?Weight: 82.3 kg 78.5 kg 76.4 kg  ? ? ? ?Condition at discharge: fair ? ?The results of significant diagnostics from this hospitalization (including imaging, microbiology, ancillary and laboratory) are listed below for reference.  ? ?Imaging Studies: ?MR PELVIS WO CONTRAST ? ?Result Date: 02/27/2022 ?CLINICAL DATA:  Pregnant, right lower quadrant abdominal pain EXAM: MRI ABDOMEN AND PELVIS WITHOUT CONTRAST TECHNIQUE: Multiplanar multisequence MR imaging of the abdomen and pelvis was performed. No intravenous contrast was administered. COMPARISON:  OB ultrasound dated 02/27/2022 FINDINGS: Lower chest: Lung bases are clear. Hepatobiliary: Liver is within normal limits. Gallbladder is unremarkable. No intrahepatic or extrahepatic ductal dilatation. Pancreas:  Within normal limits. Spleen:  Within normal limits. Adrenals/Urinary Tract:  Adrenal glands are within normal limits. Kidneys are within normal limits.  No hydronephrosis. Bladder is within normal limits. Stomach/Bowel: Stomach is within normal limits. No evidence of bowel obstruction. Normal appendix (series 9/image 71). No colonic wall thickening or inflammatory changes. Vascular/Lymphatic:  No evidence of abdominal aortic aneurysm. No suspicious abdominopelvic lymphadenopathy. Reproductive: Early gravid uterus, better evaluated on recent OB ultrasound. Left ovary is within normal limits. 2.7 cm right ovarian corpus luteum (series 9/image 86), physiologic/benign. No follow-up recommended. Other:  Small volume pelvic ascites, likely physiologic. Musculoskeletal: No focal osseous lesions. IMPRESSION: Normal appendix. Early gravid uterus, better evaluated on recent OB ultrasound. 2.7 cm right ovarian corpus luteum, physiologic/benign. No follow-up recommended.  Electronically Signed   By: Julian Hy M.D.   On: 02/27/2022 19:09  ? ?MR ABDOMEN WO CONTRAST ? ?Result Date: 02/27/2022 ?CLINICAL DATA:  Pregnant, right lower quadrant abdominal pain EXAM: MRI ABDOMEN AND PELVIS WITHOUT CONTRAST TECHNIQUE: Multiplanar multisequence MR imaging of the abdomen and pelvis was performed. No intravenous contrast was administered. COMPARISON:  OB ultrasound dated 02/27/2022 FINDINGS: Lower chest: Lung bases are clear. Hepatobiliary: Liver is within normal limits. Gallbladder is unremarkable. No intrahepatic or extrahepatic ductal dilatation. Pancreas:  Within normal limits. Spleen:  Within normal limits. Adrenals/Urinary Tract:  Adrenal glands are within normal limits. Kidneys are within normal limits.  No hydronephrosis. Bladder is within normal limits. Stomach/Bowel: Stomach is within normal limits. No evidence of bowel obstruction. Normal appendix (series 9/image 71). No colonic wall thickening or inflammatory changes. Vascular/Lymphatic:  No evidence of abdominal aortic aneurysm. No suspicious abdominopelvic lymphadenopathy. Reproductive: Early gravid uterus, better evaluated on recent OB ultrasound. Left ovary is within normal limits. 2.7 cm right ovarian corpus luteum (series 9/image 86), physiologic/benign. No follow-up recommended. Other:  Small volume pelvic ascites, likely physiologic. Musculoskeletal: No focal osseous lesions. IMPRESSION: Normal appendix. Early gravid uterus, better evaluated on recent OB ultrasound. 2.7 cm right ovarian corpus luteum, physiologic/benign. No follow-up recommended. Electronically Signed   By: Julian Hy M.D.   On: 02/27/2022 19:09  ? ?US OB Comp Less 14 Wks ? ?Result Date: 02/27/2022 ?CLINICAL DATA:  Right lower quadrant pain since this morning. EXAM: OBSTETRIC <  48 WK ULTRASOUND TECHNIQUE: Transabdominal ultrasound was performed for evaluation of the gestation as well as the maternal uterus and adnexal regions. COMPARISON:  March 05, 2022 FINDINGS: Intrauterine gestational sac: Single Yolk sac:  Visualized. Embryo:  Visualized. Cardiac Activity: Visualized. Heart Rate: 169 bpm MSD:    mm    w     d CRL:   16.0 mm   8 w 0 d

## 2022-02-28 NOTE — Progress Notes (Signed)
Pt discharged home.  Discharge instructions, prescriptions and follow up appointment given to and reviewed with pt.  Pt verbalized understanding.  Escorted by auxillary. 

## 2022-02-28 NOTE — Discharge Instructions (Signed)
Patient will follow-up with [facility] and on her scheduled appointment ?

## 2022-02-28 NOTE — Consult Note (Signed)
PHARMACY CONSULT NOTE - FOLLOW UP ? ?Pharmacy Consult for Electrolyte Monitoring and Replacement  ? ?Recent Labs: ?Potassium (mmol/L)  ?Date Value  ?02/28/2022 2.9 (L)  ? ?Magnesium (mg/dL)  ?Date Value  ?02/27/2022 2.2  ? ?Calcium (mg/dL)  ?Date Value  ?02/28/2022 9.1  ? ?Albumin (g/dL)  ?Date Value  ?02/25/2022 4.0  ? ?Phosphorus (mg/dL)  ?Date Value  ?02/27/2022 3.3  ? ?Sodium (mmol/L)  ?Date Value  ?02/28/2022 133 (L)  ? ?Add-On magnesium: 2.1 mg/dL ? ?Assessment: ?26 y.o. female with medical history significant for asthma and hyperemesis gravidarum who is [redacted] weeks pregnant  who was admitted overnight and discharged 02/25/22 for intractable nausea and vomiting with hyperemesis gravidarum. She remains hypokalemic in spiter of aggressive replacement ? ?Goal of Therapy:  ?Electrolytes WNL ? ?Plan:  ?KCL 10 mEq x 6 IV + oral KCl 40 mEq x 1  ?Recheck potassium in the evening  ? ?Lowella Bandy ,PharmD, BCPS ?Clinical Pharmacist ?02/28/2022 8:14 AM ? ?

## 2022-03-03 ENCOUNTER — Other Ambulatory Visit: Payer: Self-pay

## 2022-03-03 ENCOUNTER — Encounter: Payer: Self-pay | Admitting: Emergency Medicine

## 2022-03-03 DIAGNOSIS — Z79899 Other long term (current) drug therapy: Secondary | ICD-10-CM | POA: Diagnosis not present

## 2022-03-03 DIAGNOSIS — J45909 Unspecified asthma, uncomplicated: Secondary | ICD-10-CM | POA: Insufficient documentation

## 2022-03-03 DIAGNOSIS — Z3A09 9 weeks gestation of pregnancy: Secondary | ICD-10-CM

## 2022-03-03 DIAGNOSIS — Z3A08 8 weeks gestation of pregnancy: Secondary | ICD-10-CM | POA: Insufficient documentation

## 2022-03-03 DIAGNOSIS — O211 Hyperemesis gravidarum with metabolic disturbance: Secondary | ICD-10-CM | POA: Diagnosis present

## 2022-03-03 DIAGNOSIS — F1290 Cannabis use, unspecified, uncomplicated: Secondary | ICD-10-CM | POA: Insufficient documentation

## 2022-03-03 LAB — CBC
HCT: 37.3 % (ref 36.0–46.0)
Hemoglobin: 13.3 g/dL (ref 12.0–15.0)
MCH: 30.9 pg (ref 26.0–34.0)
MCHC: 35.7 g/dL (ref 30.0–36.0)
MCV: 86.7 fL (ref 80.0–100.0)
Platelets: 381 10*3/uL (ref 150–400)
RBC: 4.3 MIL/uL (ref 3.87–5.11)
RDW: 12.1 % (ref 11.5–15.5)
WBC: 12.1 10*3/uL — ABNORMAL HIGH (ref 4.0–10.5)
nRBC: 0 % (ref 0.0–0.2)

## 2022-03-03 LAB — COMPREHENSIVE METABOLIC PANEL
ALT: 26 U/L (ref 0–44)
AST: 14 U/L — ABNORMAL LOW (ref 15–41)
Albumin: 4.6 g/dL (ref 3.5–5.0)
Alkaline Phosphatase: 41 U/L (ref 38–126)
Anion gap: 11 (ref 5–15)
BUN: 8 mg/dL (ref 6–20)
CO2: 27 mmol/L (ref 22–32)
Calcium: 10.3 mg/dL (ref 8.9–10.3)
Chloride: 96 mmol/L — ABNORMAL LOW (ref 98–111)
Creatinine, Ser: 0.46 mg/dL (ref 0.44–1.00)
GFR, Estimated: >60 mL/min (ref >60)
Glucose, Bld: 97 mg/dL (ref 70–99)
Potassium: 2.7 mmol/L — CL (ref 3.5–5.1)
Sodium: 134 mmol/L — ABNORMAL LOW (ref 135–145)
Total Bilirubin: 0.9 mg/dL (ref 0.3–1.2)
Total Protein: 7.6 g/dL (ref 6.5–8.1)

## 2022-03-03 LAB — LIPASE, BLOOD: Lipase: 28 U/L (ref 11–51)

## 2022-03-03 NOTE — ED Triage Notes (Signed)
Pt to ED via EMS from home c/o n/v and generalized abd cramping today.  States [redacted]wks pregnant, G3P2.  Recently admitted for hyperemesis and hypokalemia and has been unable to keep PO potassium down at home.  EMS vitals 100 HR, 100% RA, 118/80 BP.  Pt A&Ox4, chest rise even and unlabored, skin WNL and in NAD at this time. ?

## 2022-03-04 ENCOUNTER — Observation Stay
Admission: EM | Admit: 2022-03-04 | Discharge: 2022-03-04 | Disposition: A | Discharge status: Home / Self Care | Payer: Medicaid Other | Attending: Internal Medicine | Admitting: Internal Medicine

## 2022-03-04 DIAGNOSIS — E876 Hypokalemia: Secondary | ICD-10-CM

## 2022-03-04 DIAGNOSIS — O210 Mild hyperemesis gravidarum: Secondary | ICD-10-CM

## 2022-03-04 DIAGNOSIS — Z3A09 9 weeks gestation of pregnancy: Secondary | ICD-10-CM | POA: Diagnosis not present

## 2022-03-04 DIAGNOSIS — O211 Hyperemesis gravidarum with metabolic disturbance: Principal | ICD-10-CM

## 2022-03-04 DIAGNOSIS — R112 Nausea with vomiting, unspecified: Secondary | ICD-10-CM

## 2022-03-04 LAB — BASIC METABOLIC PANEL
Anion gap: 7 (ref 5–15)
BUN: 8 mg/dL (ref 6–20)
CO2: 27 mmol/L (ref 22–32)
Calcium: 8.9 mg/dL (ref 8.9–10.3)
Chloride: 100 mmol/L (ref 98–111)
Creatinine, Ser: 0.45 mg/dL (ref 0.44–1.00)
GFR, Estimated: >60 mL/min (ref >60)
Glucose, Bld: 141 mg/dL — ABNORMAL HIGH (ref 70–99)
Potassium: 3 mmol/L — ABNORMAL LOW (ref 3.5–5.1)
Sodium: 134 mmol/L — ABNORMAL LOW (ref 135–145)

## 2022-03-04 LAB — HCG, QUANTITATIVE, PREGNANCY: hCG, Beta Chain, Quant, S: 150839 m[IU]/mL — ABNORMAL HIGH (ref <5)

## 2022-03-04 LAB — CBC
HCT: 33.8 % — ABNORMAL LOW (ref 36.0–46.0)
Hemoglobin: 12 g/dL (ref 12.0–15.0)
MCH: 31 pg (ref 26.0–34.0)
MCHC: 35.5 g/dL (ref 30.0–36.0)
MCV: 87.3 fL (ref 80.0–100.0)
Platelets: 341 10*3/uL (ref 150–400)
RBC: 3.87 MIL/uL (ref 3.87–5.11)
RDW: 11.9 % (ref 11.5–15.5)
WBC: 10.2 10*3/uL (ref 4.0–10.5)
nRBC: 0 % (ref 0.0–0.2)

## 2022-03-04 LAB — MAGNESIUM: Magnesium: 2.3 mg/dL (ref 1.7–2.4)

## 2022-03-04 LAB — POTASSIUM: Potassium: 4.2 mmol/L (ref 3.5–5.1)

## 2022-03-04 MED ORDER — PROCHLORPERAZINE EDISYLATE 10 MG/2ML IJ SOLN
10.0000 mg | INTRAMUSCULAR | Status: DC | PRN
  Administered 2022-03-04: 10 mg via INTRAVENOUS
  Filled 2022-03-04 (×2): qty 2

## 2022-03-04 MED ORDER — POTASSIUM CHLORIDE 10 MEQ/100ML IV SOLN
10.0000 meq | INTRAVENOUS | Status: AC
  Administered 2022-03-04 (×6): 10 meq via INTRAVENOUS
  Filled 2022-03-04 (×5): qty 100

## 2022-03-04 MED ORDER — MAGNESIUM SULFATE 2 GM/50ML IV SOLN
2.0000 g | Freq: Once | INTRAVENOUS | Status: AC
  Administered 2022-03-04: 2 g via INTRAVENOUS
  Filled 2022-03-04: qty 50

## 2022-03-04 MED ORDER — ONDANSETRON HCL 4 MG/2ML IJ SOLN
4.0000 mg | Freq: Four times a day (QID) | INTRAMUSCULAR | Status: DC | PRN
  Administered 2022-03-04: 4 mg via INTRAVENOUS
  Filled 2022-03-04: qty 2

## 2022-03-04 MED ORDER — ACETAMINOPHEN 650 MG RE SUPP: 650.0000 mg | Freq: Four times a day (QID) | RECTAL | Status: DC | PRN

## 2022-03-04 MED ORDER — DOCUSATE SODIUM 100 MG PO CAPS
100.0000 mg | ORAL_CAPSULE | Freq: Two times a day (BID) | ORAL | Status: DC
  Administered 2022-03-04: 100 mg via ORAL
  Filled 2022-03-04: qty 1

## 2022-03-04 MED ORDER — SODIUM CHLORIDE 0.9 % IV SOLN: INTRAVENOUS | Status: AC

## 2022-03-04 MED ORDER — ACETAMINOPHEN 325 MG PO TABS: 650.0000 mg | ORAL_TABLET | Freq: Four times a day (QID) | ORAL | Status: DC | PRN

## 2022-03-04 MED ORDER — PANTOPRAZOLE SODIUM 40 MG IV SOLR
40.0000 mg | INTRAVENOUS | Status: DC
  Administered 2022-03-04: 40 mg via INTRAVENOUS
  Filled 2022-03-04 (×2): qty 10

## 2022-03-04 MED ORDER — DEXTROSE-NACL 5-0.9 % IV SOLN: INTRAVENOUS | Status: DC

## 2022-03-04 MED ORDER — METHYLPREDNISOLONE SODIUM SUCC 40 MG IJ SOLR
16.0000 mg | Freq: Once | INTRAMUSCULAR | Status: AC
  Administered 2022-03-04: 16 mg via INTRAVENOUS
  Filled 2022-03-04: qty 1

## 2022-03-04 MED ORDER — METOCLOPRAMIDE HCL 5 MG/ML IJ SOLN
10.0000 mg | INTRAMUSCULAR | Status: AC
  Administered 2022-03-04: 10 mg via INTRAVENOUS
  Filled 2022-03-04: qty 2

## 2022-03-04 MED ORDER — ONDANSETRON HCL 4 MG PO TABS
4.0000 mg | ORAL_TABLET | Freq: Four times a day (QID) | ORAL | Status: DC | PRN
  Administered 2022-03-04: 4 mg via ORAL
  Filled 2022-03-04: qty 1

## 2022-03-04 MED ORDER — MORPHINE SULFATE (PF) 2 MG/ML IV SOLN
2.0000 mg | INTRAVENOUS | Status: DC | PRN
  Administered 2022-03-04: 2 mg via INTRAVENOUS
  Filled 2022-03-04: qty 1

## 2022-03-04 MED ORDER — METHYLPREDNISOLONE SODIUM SUCC 125 MG IJ SOLR: 48.0000 mg | Freq: Once | INTRAMUSCULAR | Status: DC

## 2022-03-04 NOTE — H&P (Signed)
?History and Physical  ? ? ?Patient: Debra Murray ZJQ:[id number] DOB: 1996-02-05 ?DOA: 03/04/2022 ?DOS: the patient was seen and examined on 03/04/2022 ?PCP: Pcp, No  ?Patient coming from: Home ? ?Chief Complaint:  ?Chief Complaint  ?Patient presents with  ? Nausea  ? Vomiting  ? ? ?HPI: Debra Murray is a 26 y.o. female with medical history significant for G3 P2 at 8 to [redacted] weeks gestation, who presents to the ED for intractable vomiting at home associated with intermittent abdominal cramping.  Patient was hospitalized twice in the past 10 days at this hospital and also seen once in [facility] for the same.  Was most recently here from 4/23 to 4/27 and had a OB ultrasound that showed a single live IUP at 8 weeks and also underwent an MRI abdomen and pelvis with contrast to evaluate for other etiologies of her symptoms which was negative.  Her symptoms at this time at the same as during previous admissions.  She did test positive for cannabis. ?ED course and data review: Vitals within normal limits.  Blood work significant for potassium of 2.7.  WBC 12,000.  Beta-hCG 150 839, up from 88 612 7 days ago. ?Patient given an IV fluid bolus treated with magnesium infusion, IV potassium, Reglan, methylprednisolone and Zofran.  Hospitalist consulted for admission.  ? ?Review of Systems: As mentioned in the history of present illness. All other systems reviewed and are negative. ?Past Medical History:  ?Diagnosis Date  ? Asthma   ? ?Past Surgical History:  ?Procedure Laterality Date  ? TONSILLECTOMY    ? as a child  ? ?Social History:  reports that she has never smoked. She has never used smokeless tobacco. She reports that she does not currently use alcohol. She reports that she does not currently use drugs after having used the following drugs: Marijuana. ? ?No Known Allergies ? ?Family History  ?Problem Relation Age of Onset  ? Hypertension Mother   ? Hypertension Father   ? ? ?Prior to Admission medications   ?Medication  Sig Start Date End Date Taking? Authorizing Provider  ?doxylamine, Sleep, (UNISOM) 25 MG tablet Take 1 tablet (25 mg total) by mouth 2 (two) times daily. 02/28/22  Yes Enedina Finner, MD  ?ondansetron (ZOFRAN-ODT) 4 MG disintegrating tablet Take 4 mg by mouth every 8 (eight) hours as needed. 02/16/22  Yes [provider]  ?pantoprazole (PROTONIX) 40 MG tablet Take 1 tablet (40 mg total) by mouth daily. 02/28/22  Yes Enedina Finner, MD  ?potassium chloride (KLOR-CON M) 20 MEQ tablet Take 0.5 tablets (10 mEq total) by mouth daily. 02/28/22  Yes Enedina Finner, MD  ?predniSONE (DELTASONE) 10 MG tablet 40 mg oral per day for one day, followed by 20 mg per day for three days, followed by 10 mg per day for three days, and then 5 mg per day for seven days. 02/28/22  Yes McVey, Prudencio Pair, CNM  ?Prenatal Vit-Fe Fumarate-FA (PRENATAL VITAMIN) 27-0.8 MG TABS Take 1 tablet by mouth daily. 02/28/22  Yes Enedina Finner, MD  ?prochlorperazine (COMPAZINE) 10 MG tablet Take 1 tablet (10 mg total) by mouth every 6 (six) hours as needed for nausea or vomiting. 02/28/22  Yes Enedina Finner, MD  ? ? ?Physical Exam: ?Vitals:  ? 03/04/22 0117 03/04/22 0118 03/04/22 0130 03/04/22 0200  ?BP: 127/87  117/74 115/79  ?Pulse: 93 93 87 79  ?Resp: 18  18 18   ?Temp:      ?TempSrc:      ?SpO2: 100% 100% 99%  99%  ?Weight:      ?Height:      ? ?Physical Exam ?Vitals and nursing note reviewed.  ?Constitutional:   ?   General: She is not in acute distress. ?   Appearance: She is ill-appearing.  ?HENT:  ?   Head: Normocephalic and atraumatic.  ?Cardiovascular:  ?   Rate and Rhythm: Normal rate and regular rhythm.  ?   Heart sounds: Normal heart sounds.  ?Pulmonary:  ?   Effort: Pulmonary effort is normal.  ?   Breath sounds: Normal breath sounds.  ?Abdominal:  ?   Palpations: Abdomen is soft.  ?   Tenderness: There is no abdominal tenderness.  ?Neurological:  ?   Mental Status: Mental status is at baseline.  ? ? ? ?Data Reviewed: ?Relevant notes from primary care  and specialist visits, past discharge summaries as available in EHR, including Care Everywhere. ?Prior diagnostic testing as pertinent to current admission diagnoses ?Updated medications and problem lists for reconciliation ?ED course, including vitals, labs, imaging, treatment and response to treatment ?Triage notes, nursing and pharmacy notes and ED provider's notes ?Notable results as noted in HPI ? ? ?Assessment and Plan: ?* Hyperemesis gravidarum ?Multiple admissions for same ?IV hydration, IV antiemetics, ice chips and clear liquid diet as tolerated ?OB consult for additional recommendations ? ?Hypokalemia ?IV potassium repletion and monitor and correct as needed ? ? ? ? ? ? ?Advance Care Planning:   Code Status: Full Code  ? ?Consults: OB, Dr. Hildred Laser ? ?Family Communication: none ? ?Severity of Illness: ?The appropriate patient status for this patient is INPATIENT. Inpatient status is judged to be reasonable and necessary in order to provide the required intensity of service to ensure the patient's safety. The patient's presenting symptoms, physical exam findings, and initial radiographic and laboratory data in the context of their chronic comorbidities is felt to place them at high risk for further clinical deterioration. Furthermore, it is not anticipated that the patient will be medically stable for discharge from the hospital within 2 midnights of admission.  ? ?* I certify that at the point of admission it is my clinical judgment that the patient will require inpatient hospital care spanning beyond 2 midnights from the point of admission due to high intensity of service, high risk for further deterioration and high frequency of surveillance required.* ? ?Author: ?Andris Baumann, MD ?03/04/2022 2:54 AM ? ?For on call review [email or web address].  ?

## 2022-03-04 NOTE — ED Provider Notes (Signed)
? ?[facility] ?Provider Note ? ? ? Event Date/Time  ? First MD Initiated Contact with Patient 03/04/22 0129   ?  (approximate) ? ? ?History  ? ?Nausea and Vomiting ? ? ?HPI ? ?Debra Murray is a 26 y.o. female G3 P2 at approximately 8 to [redacted] weeks gestation.  She presents for persistent and intractable vomiting at home as well as some intermittent abdominal cramping.  She has been admitted at least twice so far this pregnancy for hyperemesis gravidarum.  She also had similar issues with her second pregnancy although her first pregnancy did not cause any issues.  She has tried multiple different kinds of medications including Phenergan (which she says makes her vomit more), Compazine, Reglan, and nothing seems to be helping.  She had a few good days after her last hospitalization but today was miserable with an inability to tolerate anything including water.  She is not actively vomiting now but keeps spitting into a bag due to persistent nausea.  She said that Phenergan suppositories also did not help in fact make her vomit more.  She has had issues with low potassium in the past and during her last hospitalization they were able to get the potassium up to about 3.5 but she has not been able to tolerate any fluids or medication today. ? ?She has not had any vaginal bleeding.  She denies any active abdominal pain or cramping, and it seems to be more related to her vomiting.  No diarrhea.  No fever, no dysuria. ?  ? ? ?Physical Exam  ? ?Triage Vital Signs: ?ED Triage Vitals  ?Enc Vitals Group  ?   BP 03/03/22 2312 133/77  ?   Pulse Rate 03/03/22 2312 95  ?   Resp 03/03/22 2312 18  ?   Temp 03/03/22 2312 98.4 ?F (36.9 ?C)  ?   Temp Source 03/03/22 2312 Oral  ?   SpO2 03/03/22 2312 95 %  ?   Weight 03/03/22 2313 77.1 kg (170 lb)  ?   Height 03/03/22 2313 1.854 m (6\' 1" )  ?   Head Circumference --   ?   Peak Flow --   ?   Pain Score 03/03/22 2313 8  ?   Pain Loc --   ?   Pain Edu? --   ?   Excl. in  [place]? --   ? ? ?Most recent vital signs: ?Vitals:  ? 03/04/22 0130 03/04/22 0200  ?BP: 117/74 115/79  ?Pulse: 87 79  ?Resp: 18 18  ?Temp:    ?SpO2: 99% 99%  ? ? ? ?General: Awake, appears uncomfortable but not in significant distress. ?CV:  Good peripheral perfusion.  Normal heart rate. ?Resp:  Normal effort.  No respiratory difficulties, no use of accessory muscles. ?Abd:  No distention.  No tenderness to palpation throughout the abdomen. ?Other:  Dry mucous membranes. ? ? ?ED Results / Procedures / Treatments  ? ?Labs ?(all labs ordered are listed, but only abnormal results are displayed) ?Labs Reviewed  ?COMPREHENSIVE METABOLIC PANEL - Abnormal; Notable for the following components:  ?    Result Value  ? Sodium 134 (*)   ? Potassium 2.7 (*)   ? Chloride 96 (*)   ? AST 14 (*)   ? All other components within normal limits  ?CBC - Abnormal; Notable for the following components:  ? WBC 12.1 (*)   ? All other components within normal limits  ?HCG, QUANTITATIVE, PREGNANCY - Abnormal; Notable for the  following components:  ? hCG, Beta Chain, Quant, S 150,839 (*)   ? All other components within normal limits  ?LIPASE, BLOOD  ?MAGNESIUM  ?URINALYSIS, ROUTINE W REFLEX MICROSCOPIC  ? ? ? ?RADIOLOGY ?No indication for emergent imaging. ? ? ? ?PROCEDURES: ? ?Critical Care performed: Yes, see critical care procedure note(s) ? ?.Critical Care ?Performed by: Hinda Kehr, MD ?Authorized by: Hinda Kehr, MD  ? ?Critical care provider statement:  ?  Critical care time (minutes):  30 ?  Critical care time was exclusive of:  Separately billable procedures and treating other patients ?  Critical care was necessary to treat or prevent imminent or life-threatening deterioration of the following conditions:  Metabolic crisis ?  Critical care was time spent personally by me on the following activities:  Development of treatment plan with patient or surrogate, evaluation of patient's response to treatment, examination of patient,  obtaining history from patient or surrogate, ordering and performing treatments and interventions, ordering and review of laboratory studies, ordering and review of radiographic studies, pulse oximetry, re-evaluation of patient's condition and review of old charts ?.1-3 Lead EKG Interpretation ?Performed by: Hinda Kehr, MD ?Authorized by: Hinda Kehr, MD  ? ?  Interpretation: normal   ?  ECG rate:  75 ?  ECG rate assessment: normal   ?  Rhythm: sinus rhythm   ?  Ectopy: none   ?  Conduction: normal   ? ? ?MEDICATIONS ORDERED IN ED: ?Medications  ?dextrose 5 %-0.9 % sodium chloride infusion (has no administration in time range)  ?metoCLOPramide (REGLAN) injection 10 mg (has no administration in time range)  ?potassium chloride 10 mEq in 100 mL IVPB (has no administration in time range)  ?magnesium sulfate IVPB 2 g 50 mL (has no administration in time range)  ?methylPREDNISolone sodium succinate (SOLU-MEDROL) 40 mg/mL injection 16 mg (has no administration in time range)  ? ? ? ?IMPRESSION / MDM / ASSESSMENT AND PLAN / ED COURSE  ?I reviewed the triage vital signs and the nursing notes. ?             ?               ? ?Differential diagnosis includes, but is not limited to, hyperemesis gravidarum, electrolyte or metabolic abnormality, renal failure, dehydration. ? ?Patient has had repeated episodes of hyperemesis and she seems to be having an acute exacerbation at this time.  She appears dry even though her vital signs are stable and within normal limits.  Labs ordered initially include comprehensive metabolic panel, beta-hCG, lipase, and CBC. ? ?I reviewed the results and her comprehensive metabolic panel is most notable for substantial hypokalemia at 2.7 in the setting of intractable nausea and vomiting and inability to tolerate oral intake.  She also has some mild hyponatremia 134.  LFTs are reassuring.  CBC is notable for a very mild leukocytosis which is anticipated under the circumstances of both pregnancy  and persistent vomiting.  Lipase is normal.  Beta hCG is as anticipated at 150,000. ? ?Patient is unable to tolerate any oral intake and has been through this multiple times.  I will consult the hospitalist service for admission as she is at significant risk for continued decompensation if she tries to go home.  She has already tried multiple oral and rectal medications with no success. ? ?To begin treatment I ordered D5 normal saline IV bolus (although she will start out at a rate of approximately 250 mL/h running concurrent with electrolytes), potassium 10 mill  equivalents IV x6 doses, magnesium 2 g IV to assist with potassium repletion, Reglan 10 mg IV, and methylprednisolone 60 mg IV for her refractory hyperemesis.  The patient understands and agrees with the plan.  I added on a magnesium level for baseline as well but I am going to repleted regardless. ? ? ?The patient is on the cardiac monitor to evaluate for evidence of arrhythmia and/or significant heart rate changes. ? ?Clinical Course as of 03/04/22 0242  ?Thu Mar 04, 2022  ?0230 Of note, the patient has not yet been able to provide a urine specimen but a urinalysis was ordered. [CF]  ?59 Consulted the hospitalist service and discussed the case by secure chat message with Dr. Damita Dunnings.  She agrees with the plan and will admit the patient for additional management. [CF]  ?  ?Clinical Course User Index ?[CF] Hinda Kehr, MD  ? ? ? ?FINAL CLINICAL IMPRESSION(S) / ED DIAGNOSES  ? ?Final diagnoses:  ?Hyperemesis gravidarum with electrolyte imbalance  ?Hypokalemia  ?Intractable nausea and vomiting  ? ? ? ?Rx / DC Orders  ? ?ED Discharge Orders   ? ? None  ? ?  ? ? ? ?Note:  This document was prepared using Dragon voice recognition software and may include unintentional dictation errors. ?  ?Hinda Kehr, MD ?03/04/22 [phone] ? ?

## 2022-03-04 NOTE — Discharge Summary (Signed)
Physician Discharge Summary  ?Debra Murray CLE:[id number] DOB: May 22, 1996 DOA: 03/04/2022 ? ?PCP: Pcp, No ? ?Admit date: 03/04/2022 ?Discharge date: 03/04/2022 ?Recommendations for Outpatient Follow-up:  ?Follow up with PCP in 1 weeks-call for appointment ?Please obtain BMP/CBC in one week ? ?Discharge Dispo: home ?Discharge Condition: Stable ?Code Status:   Code Status: Full Code ?Diet recommendation:  ?Diet Order   ? ?       ?  Diet regular Room service appropriate? Yes; Fluid consistency: Thin  Diet effective now       ?  ? ?  ?  ? ?  ?  ? ?Brief/Interim Summary: ?26 y.o. female G3 P2 at approximately 8 to [redacted] weeks gestation recent admissions for hyperemesis gravidarum dc date 4/20 and 4/23-had MRI abdomen pelvis without contrast on last admission and was unremarkable-presents with persistent intractable vomiting , some intermittent abdominal cramping  that did not improve on Phenergan suppositories (which she says makes her vomit more), Compazine, Reglan. ?In ED- not actively vomiting now but kept spitting into a bag due to persistent nausea.She has had issues with low potassium in the past and during her last hospitalization they were able to get the potassium up to about 3.5 but she has not been able to tolerate any fluids or medications ?Had not had any vaginal bleeding no active abdominal pain or cramping.   ?Patient was able to tolerate regular breakfast and lunch this afternoon without any nausea and vomiting.  She felt well and desired to go home.  OB was consulted nursing spoke with OB and she has no further recommendation and okay for discharge. ? ?Discharge Diagnoses:  ?Principal Problem: ?  Hyperemesis gravidarum ?Active Problems: ?  Hypokalemia ?Hyperemesis gravidarum ?Hypokalemia ?Cannabinoid positive in urine: ?Pregnant 8-9wk: ?Patient improved with symptomatic management.  Tolerating regular diet.  OB has no further recommendation, RN spoke with OB for [facility] and has no further  recommendation and okay for discharge to home.  Patient is to follow-up up with her OB/GYN and primary care doctor as soon as possible preferably 1 week. ?Discussed with OB okay for discharge home and she will follow-up with [facility]. ?No further prescription needed patient has supplies medication from last discharge. ?Consults: ?Ob gyn ?Subjective: ?Aaox3, no nausea vomiting tolerated breakfast this morning ? ?Discharge Exam: ?Vitals:  ? 03/04/22 1030 03/04/22 1139  ?BP:  120/70  ?Pulse: 93 79  ?Resp: (!) 23 20  ?Temp:  98.1 ?F (36.7 ?C)  ?SpO2: 99% 99%  ? ?General: Pt is alert, awake, not in acute distress ?Cardiovascular: RRR, S1/S2 +, no rubs, no gallops ?Respiratory: CTA bilaterally, no wheezing, no rhonchi ?Abdominal: Soft, NT, ND, bowel sounds + ?Extremities: no edema, no cyanosis ? ?Discharge Instructions ? ?Discharge Instructions   ? ? Discharge instructions   Complete by: As directed ?  ? Follow up with you OB GYN doctor in 1 wk ?Please call call MD or return to ER for similar or worsening recurring problem that brought you to hospital or if any fever,nausea/vomiting,abdominal pain, uncontrolled pain, chest pain,  shortness of breath or any other alarming symptoms. ? ?Please follow-up your doctor as instructed in a week time and call the office for appointment. ? ?Please avoid alcohol, smoking, or any other illicit substance and maintain healthy habits including taking your regular medications as prescribed. ? ?You were cared for by a hospitalist during your hospital stay. If you have any questions about your discharge medications or the care you received while you were in  the hospital after you are discharged, you can call the unit and ask to speak with the hospitalist on call if the hospitalist that took care of you is not available. ? ?Once you are discharged, your primary care physician will handle any further medical issues. Please note that NO REFILLS for any discharge medications will be  authorized once you are discharged, as it is imperative that you return to your primary care physician (or establish a relationship with a primary care physician if you do not have one) for your aftercare needs so that they can reassess your need for medications and monitor your lab values  ? Increase activity slowly   Complete by: As directed ?  ? ?  ? ?Allergies as of 03/04/2022   ?No Known Allergies ?  ? ?  ?Medication List  ?  ? ?TAKE these medications   ? ?doxylamine (Sleep) 25 MG tablet ?Commonly known as: UNISOM ?Take 1 tablet (25 mg total) by mouth 2 (two) times daily. ?  ?ondansetron 4 MG disintegrating tablet ?Commonly known as: ZOFRAN-ODT ?Take 4 mg by mouth every 8 (eight) hours as needed. ?  ?pantoprazole 40 MG tablet ?Commonly known as: PROTONIX ?Take 1 tablet (40 mg total) by mouth daily. ?  ?potassium chloride SA 20 MEQ tablet ?Commonly known as: KLOR-CON M ?Take 0.5 tablets (10 mEq total) by mouth daily. ?  ?predniSONE 10 MG tablet ?Commonly known as: DELTASONE ?40 mg oral per day for one day, followed by 20 mg per day for three days, followed by 10 mg per day for three days, and then 5 mg per day for seven days. ?  ?Prenatal Vitamin 27-0.8 MG Tabs ?Take 1 tablet by mouth daily. ?  ?prochlorperazine 10 MG tablet ?Commonly known as: COMPAZINE ?Take 1 tablet (10 mg total) by mouth every 6 (six) hours as needed for nausea or vomiting. ?  ? ?  ? ? Follow-up Information   ? ? [facility] Follow up in 1 week(s).   ? ?  ?  ? ?  ?  ? ?  ? ?No Known Allergies ? ?The results of significant diagnostics from this hospitalization (including imaging, microbiology, ancillary and laboratory) are listed below for reference.   ? ?Microbiology: ?No results found for this or any previous visit (from the past 240 hour(s)).  ?Procedures/Studies: ?MR PELVIS WO CONTRAST ? ?Result Date: 02/27/2022 ?CLINICAL DATA:  Pregnant, right lower quadrant abdominal pain EXAM: MRI ABDOMEN AND PELVIS WITHOUT CONTRAST TECHNIQUE:  Multiplanar multisequence MR imaging of the abdomen and pelvis was performed. No intravenous contrast was administered. COMPARISON:  OB ultrasound dated 02/27/2022 FINDINGS: Lower chest: Lung bases are clear. Hepatobiliary: Liver is within normal limits. Gallbladder is unremarkable. No intrahepatic or extrahepatic ductal dilatation. Pancreas:  Within normal limits. Spleen:  Within normal limits. Adrenals/Urinary Tract:  Adrenal glands are within normal limits. Kidneys are within normal limits.  No hydronephrosis. Bladder is within normal limits. Stomach/Bowel: Stomach is within normal limits. No evidence of bowel obstruction. Normal appendix (series 9/image 71). No colonic wall thickening or inflammatory changes. Vascular/Lymphatic:  No evidence of abdominal aortic aneurysm. No suspicious abdominopelvic lymphadenopathy. Reproductive: Early gravid uterus, better evaluated on recent OB ultrasound. Left ovary is within normal limits. 2.7 cm right ovarian corpus luteum (series 9/image 86), physiologic/benign. No follow-up recommended. Other:  Small volume pelvic ascites, likely physiologic. Musculoskeletal: No focal osseous lesions. IMPRESSION: Normal appendix. Early gravid uterus, better evaluated on recent OB ultrasound. 2.7 cm right ovarian corpus luteum, physiologic/benign.  No follow-up recommended. Electronically Signed   By: Charline Bills M.D.   On: 02/27/2022 19:09  ? ?MR ABDOMEN WO CONTRAST ? ?Result Date: 02/27/2022 ?CLINICAL DATA:  Pregnant, right lower quadrant abdominal pain EXAM: MRI ABDOMEN AND PELVIS WITHOUT CONTRAST TECHNIQUE: Multiplanar multisequence MR imaging of the abdomen and pelvis was performed. No intravenous contrast was administered. COMPARISON:  OB ultrasound dated 02/27/2022 FINDINGS: Lower chest: Lung bases are clear. Hepatobiliary: Liver is within normal limits. Gallbladder is unremarkable. No intrahepatic or extrahepatic ductal dilatation. Pancreas:  Within normal limits. Spleen:   Within normal limits. Adrenals/Urinary Tract:  Adrenal glands are within normal limits. Kidneys are within normal limits.  No hydronephrosis. Bladder is within normal limits. Stomach/Bowel: Stomach is within normal limits. No evid

## 2022-03-04 NOTE — Hospital Course (Addendum)
26 y.o. female G3 P2 at approximately 8 to [redacted] weeks gestation recent admissions for hyperemesis gravidarum dc date 4/20 and 4/23-had MRI abdomen pelvis without contrast on last admission and was unremarkable-presents with persistent intractable vomiting , some intermittent abdominal cramping  that did not improve on Phenergan suppositories (which she says makes her vomit more), Compazine, Reglan. ?In ED- not actively vomiting now but kept spitting into a bag due to persistent nausea.She has had issues with low potassium in the past and during her last hospitalization they were able to get the potassium up to about 3.5 but she has not been able to tolerate any fluids or medications ?Had not had any vaginal bleeding no active abdominal pain or cramping.  ?

## 2022-03-04 NOTE — ED Notes (Signed)
  Attempted to call report.  RN will call me back. 

## 2022-03-04 NOTE — Assessment & Plan Note (Signed)
Multiple admissions for same ?IV hydration, IV antiemetics, ice chips and clear liquid diet as tolerated ?OB consult for additional recommendations ?

## 2022-03-04 NOTE — Progress Notes (Signed)
Pt ate half french toast, hard boiled egg, 2 juice.  ?No N/V after.  ?

## 2022-03-04 NOTE — Progress Notes (Signed)
Patient seen and examined personally, I reviewed the chart, history and physical and admission note, done by admitting physician this morning and agree with the same with following addendum.  Please refer to the morning admission note for more detailed plan of care. ? ?Briefly,  ?26 y.o. female G3 P2 at approximately 8 to [redacted] weeks gestation recent admissions for hyperemesis gravidarum dc date 4/20 and 4/23-had MRI abdomen pelvis without contrast on last admission and was unremarkable-presents with persistent intractable vomiting , some intermittent abdominal cramping  that did not improve on Phenergan suppositories (which she says makes her vomit more), Compazine, Reglan. ?In ED- not actively vomiting now but kept spitting into a bag due to persistent nausea.She has had issues with low potassium in the past and during her last hospitalization they were able to get the potassium up to about 3.5 but she has not been able to tolerate any fluids or medications ?Had not had any vaginal bleeding no active abdominal pain or cramping.   ? ?On my exam this morning denies any vomiting, reports she was able to heat some breakfast- egg, half french toast.  Denies any abdominal pain. ? ?issues ?Hyperemesis gravidarum ?Hypokalemia ?Cannabinoid positive in urine: ?Pregnant 8-9wk: ?Refer to H&P for details continue current plan of care with normal saline 125 cc/h, PPI, IV potassium supplement,-repeat labs this afternoon, continue antiemetics pain control.  OB/GYN consulted and continue plan of care per GYN, pharmacy consult to monitor medication in the setting of pregnancy ?

## 2022-03-04 NOTE — ED Notes (Signed)
?  Asked patient if she could obtain a urine specimen and she said no.  Patient asked for something to drink to help but has not seen EDP yet.  ?

## 2022-03-04 NOTE — Progress Notes (Signed)
Patient discharged home with family.  ?Discharge instructions, when to follow up, and medications reviewed with patient.  ?Patient verbalized understanding. ?  ?

## 2022-03-04 NOTE — Assessment & Plan Note (Signed)
IV potassium repletion and monitor and correct as needed ?

## 2022-03-04 NOTE — Progress Notes (Signed)
ANTEPARTUM PROGRESS NOTE ? ?Debra NevinBreanna Murray is a 26 y.o. G3P2002 at 28125w4d who is admitted for Hyperemesis gravidarum, Hypokalemia.   ?Estimated Date of Delivery: 10/10/22 ? ?Length of Stay:  0 Days. Admitted 03/04/2022 ? ?Subjective: ? ? ?She reports: feeling much better after she received K+ replacement. She states she was able to keep down her regular breakfast and lunch without nausea/vomiting.  ?-no leakage of fluid ?-no vaginal bleeding ? ? ?Vitals:  BP 120/70 (BP Location: Right Arm)   Pulse 79   Temp 98.1 ?F (36.7 ?C) (Oral)   Resp 20   Ht 6\' 1"  (1.854 m)   Wt 77.1 kg   LMP 01/02/2022   SpO2 99%   BMI 22.43 kg/m?  ?Physical Examination: ?General:   alert, cooperative, and appears stated age  ?Skin:  normal  ?Neurologic:    Alert & oriented x 3  ?Lungs:   clear to auscultation bilaterally  ?Heart:   regular rate and rhythm, S1, S2 normal, no murmur, click, rub or gallop  ?Abdomen:  soft, non-tender; bowel sounds normal; no masses,  no organomegaly  ?Pelvis:  Exam deferred.  ?   ?    ?    ?    ?    ?    ?Extremities: : non-tender, symmetric, no edema bilaterally.     ? ?Fetal monitoring: none 8.4 weeks ? ? ?Results for orders placed or performed during the hospital encounter of 03/04/22 (from the past 48 hour(s))  ?Lipase, blood     Status: None  ? Collection Time: 03/03/22 11:15 PM  ?Result Value Ref Range  ? Lipase 28 11 - 51 U/L  ?  Comment: Performed at [facility], [address] [place], [place] [address]  ?Comprehensive metabolic panel     Status: Abnormal  ? Collection Time: 03/03/22 11:15 PM  ?Result Value Ref Range  ? Sodium 134 (L) 135 - 145 mmol/L  ? Potassium 2.7 (LL) 3.5 - 5.1 mmol/L  ?  Comment: CRITICAL RESULT CALLED TO, READ BACK BY AND VERIFIED WITH ?ASHLEY ORSUTO @2355  ON 03/03/22 SKL ?  ? Chloride 96 (L) 98 - 111 mmol/L  ? CO2 27 22 - 32 mmol/L  ? Glucose, Bld 97 70 - 99 mg/dL  ?  Comment: Glucose reference range applies only to samples taken after fasting for at least 8  hours.  ? BUN 8 6 - 20 mg/dL  ? Creatinine, Ser 0.46 0.44 - 1.00 mg/dL  ? Calcium 10.3 8.9 - 10.3 mg/dL  ? Total Protein 7.6 6.5 - 8.1 g/dL  ? Albumin 4.6 3.5 - 5.0 g/dL  ? AST 14 (L) 15 - 41 U/L  ? ALT 26 0 - 44 U/L  ? Alkaline Phosphatase 41 38 - 126 U/L  ? Total Bilirubin 0.9 0.3 - 1.2 mg/dL  ? GFR, Estimated >60 >60 mL/min  ?  Comment: (NOTE) ?Calculated using the CKD-EPI Creatinine Equation (2021) ?  ? Anion gap 11 5 - 15  ?  Comment: Performed at [facility], [address] [place], [place] [address]  ?CBC     Status: Abnormal  ? Collection Time: 03/03/22 11:15 PM  ?Result Value Ref Range  ? WBC 12.1 (H) 4.0 - 10.5 K/uL  ? RBC 4.30 3.87 - 5.11 MIL/uL  ? Hemoglobin 13.3 12.0 - 15.0 g/dL  ? HCT 37.3 36.0 - 46.0 %  ? MCV 86.7 80.0 - 100.0 fL  ? MCH 30.9 26.0 - 34.0 pg  ? MCHC 35.7 30.0 -  36.0 g/dL  ? RDW 12.1 11.5 - 15.5 %  ? Platelets 381 150 - 400 K/uL  ? nRBC 0.0 0.0 - 0.2 %  ?  Comment: Performed at [facility], [address] [place], [place] [address]  ?hCG, quantitative, pregnancy     Status: Abnormal  ? Collection Time: 03/03/22 11:15 PM  ?Result Value Ref Range  ? hCG, Beta Chain, Quant, S 150,839 (H) <5 mIU/mL  ?  Comment:        ?  GEST. AGE      CONC.  (mIU/mL) ?  <=1 WEEK        5 - 50 ?    2 WEEKS       50 - 500 ?    3 WEEKS       100 - 10,000 ?    4 WEEKS     1,000 - 30,000 ?    5 WEEKS     3,500 - 115,000 ?  6-8 WEEKS     12,000 - 270,000 ?   12 WEEKS     15,000 - 220,000 ?       ?FEMALE AND NON-PREGNANT FEMALE: ?    LESS THAN 5 mIU/mL ?Performed at [facility], [address], [place], [place] [address] ?  ?Magnesium     Status: None  ? Collection Time: 03/03/22 11:15 PM  ?Result Value Ref Range  ? Magnesium 2.3 1.7 - 2.4 mg/dL  ?  Comment: Performed at [facility], [address] [place], [place] [address]  ?Basic metabolic panel     Status: Abnormal  ? Collection Time: 03/04/22  5:20 AM  ?Result Value Ref Range  ? Sodium 134 (L) 135 - 145 mmol/L  ?  Potassium 3.0 (L) 3.5 - 5.1 mmol/L  ? Chloride 100 98 - 111 mmol/L  ? CO2 27 22 - 32 mmol/L  ? Glucose, Bld 141 (H) 70 - 99 mg/dL  ?  Comment: Glucose reference range applies only to samples taken after fasting for at least 8 hours.  ? BUN 8 6 - 20 mg/dL  ? Creatinine, Ser 0.45 0.44 - 1.00 mg/dL  ? Calcium 8.9 8.9 - 10.3 mg/dL  ? GFR, Estimated >60 >60 mL/min  ?  Comment: (NOTE) ?Calculated using the CKD-EPI Creatinine Equation (2021) ?  ? Anion gap 7 5 - 15  ?  Comment: Performed at [facility], [address] [place], [place] [address]  ?CBC     Status: Abnormal  ? Collection Time: 03/04/22  5:20 AM  ?Result Value Ref Range  ? WBC 10.2 4.0 - 10.5 K/uL  ? RBC 3.87 3.87 - 5.11 MIL/uL  ? Hemoglobin 12.0 12.0 - 15.0 g/dL  ? HCT 33.8 (L) 36.0 - 46.0 %  ? MCV 87.3 80.0 - 100.0 fL  ? MCH 31.0 26.0 - 34.0 pg  ? MCHC 35.5 30.0 - 36.0 g/dL  ? RDW 11.9 11.5 - 15.5 %  ? Platelets 341 150 - 400 K/uL  ? nRBC 0.0 0.0 - 0.2 %  ?  Comment: Performed at [facility], [address], [place], [place] [address]  ?Potassium     Status: None  ? Collection Time: 03/04/22  1:17 PM  ?Result Value Ref Range  ? Potassium 4.2 3.5 - 5.1 mmol/L  ?  Comment: Performed at [facility], [address], [place], [place] [address]  ? ? ?No results found. ? ?Current scheduled medications ? docusate sodium  100 mg Oral BID  ? pantoprazole (PROTONIX) IV  40 mg Intravenous Q24H  ? ? ?I have reviewed the patient's current medications. ? ?ASSESSMENT: ?Patient Active Problem List  ? Diagnosis Date Noted  ? Hyperemesis gravidarum 02/25/2022  ? GI bleeding 02/25/2022  ? Hypokalemia 02/25/2022  ? ? ?PLAN: ?-Ok to discharge home from an OB standpoint ?-Patient to follow up at [facility] and f/u ? ? ? ?Chari Manning ?Certified Nurse Midwife ?[facility] ?[facility]  ?

## 2022-03-05 ENCOUNTER — Other Ambulatory Visit: Payer: Self-pay

## 2022-03-05 ENCOUNTER — Observation Stay
Admission: EM | Admit: 2022-03-05 | Discharge: 2022-03-07 | Disposition: A | Discharge status: Home / Self Care | Payer: Medicaid Other | Attending: Student in an Organized Health Care Education/Training Program | Admitting: Student in an Organized Health Care Education/Training Program

## 2022-03-05 DIAGNOSIS — E876 Hypokalemia: Secondary | ICD-10-CM | POA: Diagnosis present

## 2022-03-05 DIAGNOSIS — R112 Nausea with vomiting, unspecified: Secondary | ICD-10-CM | POA: Diagnosis not present

## 2022-03-05 DIAGNOSIS — Z3A09 9 weeks gestation of pregnancy: Secondary | ICD-10-CM | POA: Diagnosis not present

## 2022-03-05 DIAGNOSIS — O210 Mild hyperemesis gravidarum: Principal | ICD-10-CM | POA: Insufficient documentation

## 2022-03-05 DIAGNOSIS — O211 Hyperemesis gravidarum with metabolic disturbance: Principal | ICD-10-CM | POA: Diagnosis present

## 2022-03-05 LAB — BASIC METABOLIC PANEL
Anion gap: 10 (ref 5–15)
BUN: 6 mg/dL (ref 6–20)
CO2: 25 mmol/L (ref 22–32)
Calcium: 9.8 mg/dL (ref 8.9–10.3)
Chloride: 97 mmol/L — ABNORMAL LOW (ref 98–111)
Creatinine, Ser: 0.5 mg/dL (ref 0.44–1.00)
GFR, Estimated: >60 mL/min (ref >60)
Glucose, Bld: 97 mg/dL (ref 70–99)
Potassium: 3 mmol/L — ABNORMAL LOW (ref 3.5–5.1)
Sodium: 132 mmol/L — ABNORMAL LOW (ref 135–145)

## 2022-03-05 LAB — CBC WITH DIFFERENTIAL/PLATELET
Abs Immature Granulocytes: 0.04 10*3/uL (ref 0.00–0.07)
Basophils Absolute: 0 10*3/uL (ref 0.0–0.1)
Basophils Relative: 0 %
Eosinophils Absolute: 0 10*3/uL (ref 0.0–0.5)
Eosinophils Relative: 0 %
HCT: 37.8 % (ref 36.0–46.0)
Hemoglobin: 13.2 g/dL (ref 12.0–15.0)
Immature Granulocytes: 0 %
Lymphocytes Relative: 17 %
Lymphs Abs: 2.1 10*3/uL (ref 0.7–4.0)
MCH: 30.4 pg (ref 26.0–34.0)
MCHC: 34.9 g/dL (ref 30.0–36.0)
MCV: 87.1 fL (ref 80.0–100.0)
Monocytes Absolute: 0.6 10*3/uL (ref 0.1–1.0)
Monocytes Relative: 5 %
Neutro Abs: 9.5 10*3/uL — ABNORMAL HIGH (ref 1.7–7.7)
Neutrophils Relative %: 78 %
Platelets: 385 10*3/uL (ref 150–400)
RBC: 4.34 MIL/uL (ref 3.87–5.11)
RDW: 11.9 % (ref 11.5–15.5)
WBC: 12.2 10*3/uL — ABNORMAL HIGH (ref 4.0–10.5)
nRBC: 0 % (ref 0.0–0.2)

## 2022-03-05 LAB — MAGNESIUM: Magnesium: 2.1 mg/dL (ref 1.7–2.4)

## 2022-03-05 MED ORDER — MAGNESIUM SULFATE 2 GM/50ML IV SOLN
2.0000 g | Freq: Once | INTRAVENOUS | Status: AC
  Administered 2022-03-06: 2 g via INTRAVENOUS
  Filled 2022-03-05: qty 50

## 2022-03-05 MED ORDER — POTASSIUM CHLORIDE 10 MEQ/100ML IV SOLN
10.0000 meq | Freq: Once | INTRAVENOUS | Status: AC
Start: 2022-03-05 — End: 2022-03-05
  Administered 2022-03-05: 10 meq via INTRAVENOUS
  Filled 2022-03-05: qty 100

## 2022-03-05 MED ORDER — POTASSIUM CHLORIDE 10 MEQ/100ML IV SOLN: 10.0000 meq | Freq: Once | INTRAVENOUS | Status: DC

## 2022-03-05 MED ORDER — FAMOTIDINE IN NACL 20-0.9 MG/50ML-% IV SOLN
20.0000 mg | Freq: Once | INTRAVENOUS | Status: AC
  Administered 2022-03-05: 20 mg via INTRAVENOUS
  Filled 2022-03-05: qty 50

## 2022-03-05 MED ORDER — POTASSIUM CHLORIDE CRYS ER 20 MEQ PO TBCR
10.0000 meq | EXTENDED_RELEASE_TABLET | Freq: Once | ORAL | Status: DC
Start: 2022-03-05 — End: 2022-03-05
  Filled 2022-03-05: qty 1

## 2022-03-05 MED ORDER — SODIUM CHLORIDE 0.9 % IV BOLUS
1000.0000 mL | Freq: Once | INTRAVENOUS | Status: AC
  Administered 2022-03-05: 1000 mL via INTRAVENOUS

## 2022-03-05 MED ORDER — DIPHENHYDRAMINE HCL 50 MG/ML IJ SOLN
50.0000 mg | Freq: Once | INTRAMUSCULAR | Status: AC
  Administered 2022-03-05: 50 mg via INTRAVENOUS
  Filled 2022-03-05: qty 1

## 2022-03-05 MED ORDER — CAPSAICIN 0.025 % EX CREA
TOPICAL_CREAM | Freq: Two times a day (BID) | CUTANEOUS | Status: DC
  Filled 2022-03-05 (×2): qty 60

## 2022-03-05 MED ORDER — METOCLOPRAMIDE HCL 5 MG/ML IJ SOLN
10.0000 mg | Freq: Once | INTRAMUSCULAR | Status: AC
Start: 2022-03-05 — End: 2022-03-05
  Administered 2022-03-05: 10 mg via INTRAVENOUS
  Filled 2022-03-05: qty 2

## 2022-03-05 MED ORDER — ONDANSETRON 4 MG PO TBDP
ORAL_TABLET | ORAL | Status: AC
  Administered 2022-03-05: 4 mg
  Filled 2022-03-05: qty 1

## 2022-03-05 NOTE — ED Provider Notes (Signed)
? ?[facility] ?Provider Note ? ?Patient Contact: 5:42 PM (approximate) ? ? ?History  ? ?Nausea and Emesis ? ? ?HPI ? ?Debra Murray is a 26 y.o. female G3, P2 approximately [redacted] weeks gestation presents to the emergency department with intractable vomiting.  Patient had been previously admitted and was discharged yesterday for hyperemesis gravidarum.  Patient has been taking Reglan and Solu-Medrol at home.  Prior to discharge, patient states that she was able to eat but started feeling nauseated at home this morning and has had multiple episodes of vomiting.  Patient is requesting both dinner and apple juice.  She denies pelvic pain.  Patient states that she does have acid reflux from all of her vomiting but denies current chest pain, chest tightness or shortness of breath. ? ?  ? ? ?Physical Exam  ? ?Triage Vital Signs: ?ED Triage Vitals [03/05/22 1540]  ?Enc Vitals Group  ?   BP 120/81  ?   Pulse Rate 93  ?   Resp 16  ?   Temp 98.5 ?F (36.9 ?C)  ?   Temp src   ?   SpO2 100 %  ?   Weight   ?   Height   ?   Head Circumference   ?   Peak Flow   ?   Pain Score   ?   Pain Loc   ?   Pain Edu?   ?   Excl. in [place]?   ? ? ?Most recent vital signs: ?Vitals:  ? 03/05/22 1540  ?BP: 120/81  ?Pulse: 93  ?Resp: 16  ?Temp: 98.5 ?F (36.9 ?C)  ?SpO2: 100%  ? ? ? ?General: Alert and in no acute distress. ?Eyes:  PERRL. EOMI. ?Head: No acute traumatic findings ?ENT: ?     Ears: Tms are pearly ?     Nose: No congestion/rhinnorhea. ?     Mouth/Throat: Mucous membranes are moist. ?Neck: No stridor. No cervical spine tenderness to palpation. ?Cardiovascular:  Good peripheral perfusion ?Respiratory: Normal respiratory effort without tachypnea or retractions. Lungs CTAB. Good air entry to the bases with no decreased or absent breath sounds. ?Gastrointestinal: Bowel sounds ?4 quadrants. Soft and nontender to palpation. No guarding or rigidity. No palpable masses. No distention. No CVA tenderness. ?Musculoskeletal: Full  range of motion to all extremities.  ?Neurologic:  No gross focal neurologic deficits are appreciated.  ?Skin:   No rash noted ? ? ? ?ED Results / Procedures / Treatments  ? ?Labs ?(all labs ordered are listed, but only abnormal results are displayed) ?Labs Reviewed  ?CBC WITH DIFFERENTIAL/PLATELET - Abnormal; Notable for the following components:  ?    Result Value  ? WBC 12.2 (*)   ? Neutro Abs 9.5 (*)   ? All other components within normal limits  ?BASIC METABOLIC PANEL - Abnormal; Notable for the following components:  ? Sodium 132 (*)   ? Potassium 3.0 (*)   ? Chloride 97 (*)   ? All other components within normal limits  ?MAGNESIUM  ?URINALYSIS, ROUTINE W REFLEX MICROSCOPIC  ?URINE DRUG SCREEN, QUALITATIVE ([facility] ONLY)  ? ? ? ? ? ? ? ? ?PROCEDURES: ? ?Critical Care performed: No ? ?Procedures ? ? ?MEDICATIONS ORDERED IN ED: ?Medications  ?potassium chloride SA (KLOR-CON M) CR tablet 10 mEq (has no administration in time range)  ?sodium chloride 0.9 % bolus 1,000 mL (has no administration in time range)  ?metoCLOPramide (REGLAN) injection 10 mg (has no administration in time range)  ?famotidine (  PEPCID) IVPB 20 mg premix (has no administration in time range)  ?ondansetron (ZOFRAN-ODT) 4 MG disintegrating tablet (4 mg  Given 03/05/22 1547)  ? ? ? ?IMPRESSION / MDM / ASSESSMENT AND PLAN / ED COURSE  ?I reviewed the triage vital signs and the nursing notes. ?             ?               ? ?Differential diagnosis includes, but is not limited to, hyperemesis gravidarum, hypokalemia, dehydration, UTI.Marland Kitchen ? ?Assessment and plan:  ?Hyperemesis gravidarum ?26 year old female presents to the emergency department with persistent nausea and vomiting following discharge from recent admission. ? ?Vital signs are reassuring at triage.  On physical exam, patient was alert and can provide historical details.  She was spitting copiously on exam and did have multiple episodes of emesis while waiting for work-up/care. ? ?Patient was  given 2 L of normal saline, Pepcid, Reglan and Benadryl while in the emergency department and despite these interventions, patient stated that her nausea and vomiting persisted and she requested admission. ? ?I discussed patient case with attending Dr. Leim Fabry who accepted patient for admission for further care and management. ? ?Clinical Course as of 03/05/22 2329  ?Fri Mar 05, 2022  ?2118 HCT: 37.8 [JW]  ?  ?Clinical Course User Index ?[JW] Vallarie Mare M, PA-C  ? ? ? ?FINAL CLINICAL IMPRESSION(S) / ED DIAGNOSES  ? ?Final diagnoses:  ?None  ? ? ? ?Rx / DC Orders  ? ?ED Discharge Orders   ? ? None  ? ?  ? ? ? ?Note:  This document was prepared using Dragon voice recognition software and may include unintentional dictation errors. ?  ?Lannie Fields, PA-C ?03/05/22 2331 ? ?  ?Duffy Bruce, MD ?03/06/22 1527 ? ?

## 2022-03-05 NOTE — ED Triage Notes (Signed)
Pt arrives with n/v. Pt is [redacted] weeks pregnant and seen for the same yesterday. Pt does c/o chest pain. Per pt, she did vomit blood this afternoon.  ?

## 2022-03-05 NOTE — H&P (Signed)
?History and Physical  ? ? ?Patient: Debra Murray [id number] DOB: [birth year] ?DOA: 03/05/2022 ?DOS: the patient was seen and examined on 03/06/2022 ?PCP: Pcp, No  ?Patient coming from: Home ? ?Chief Complaint:  ?Chief Complaint  ?Patient presents with  ? Nausea  ? Emesis  ? ?HPI: Debra Murray is a 26 y.o. female with medical history significant of THC abuse comes to [place] again for N/V and suspect THC cannabinoid hyperemesis and less likely hyperemesis gravidarum. Pt is coming from home and has come to ed the entire month of April. Pt reports that she is no longer using THC after she found out she is pregnant is still probably in her system.  Patient asking for something to eat states that she is very hungry and would like some apple juice and may be something to eat.  I asked if she think she can tolerate it and she said yes. ? ?Review of Systems: Review of Systems  ?Gastrointestinal:  Positive for nausea and vomiting.  ?All other systems reviewed and are negative. ? ?Past Medical History:  ?Diagnosis Date  ? Asthma   ? ?Past Surgical History:  ?Procedure Laterality Date  ? TONSILLECTOMY    ? as a child  ? ?Social History:  reports that she has never smoked. She has never used smokeless tobacco. She reports that she does not currently use alcohol. She reports that she does not currently use drugs after having used the following drugs: Marijuana. ? ?No Known Allergies ? ?Family History  ?Problem Relation Age of Onset  ? Hypertension Mother   ? Hypertension Father   ? ? ?Prior to Admission medications   ?Medication Sig Start Date End Date Taking? Authorizing Provider  ?doxylamine, Sleep, (UNISOM) 25 MG tablet Take 1 tablet (25 mg total) by mouth 2 (two) times daily. 02/28/22   Fritzi Mandes, MD  ?ondansetron (ZOFRAN-ODT) 4 MG disintegrating tablet Take 4 mg by mouth every 8 (eight) hours as needed. 02/16/22   [provider]  ?potassium chloride (KLOR-CON M) 20 MEQ tablet Take 0.5 tablets (10 mEq total) by  mouth daily. 02/28/22   Fritzi Mandes, MD  ?predniSONE (DELTASONE) 10 MG tablet 40 mg oral per day for one day, followed by 20 mg per day for three days, followed by 10 mg per day for three days, and then 5 mg per day for seven days. 02/28/22   McVey, Murray Hodgkins, CNM  ?Prenatal Vit-Fe Fumarate-FA (PRENATAL VITAMIN) 27-0.8 MG TABS Take 1 tablet by mouth daily. 02/28/22   Fritzi Mandes, MD  ?prochlorperazine (COMPAZINE) 10 MG tablet Take 1 tablet (10 mg total) by mouth every 6 (six) hours as needed for nausea or vomiting. 02/28/22   Fritzi Mandes, MD  ? ? ?Physical Exam: ?Vitals:  ? 03/05/22 1540 03/05/22 1945 03/06/22 0027  ?BP: 120/81 113/72 120/74  ?Pulse: 93 89 83  ?Resp: 16 16 16   ?Temp: 98.5 ?F (36.9 ?C)    ?SpO2: 100% 98% 97%  ?Physical Exam ?Vitals and nursing note reviewed.  ?Constitutional:   ?   General: She is not in acute distress. ?   Appearance: Normal appearance. She is not ill-appearing, toxic-appearing or diaphoretic.  ?HENT:  ?   Head: Normocephalic and atraumatic.  ?   Right Ear: Hearing and external ear normal.  ?   Left Ear: Hearing and external ear normal.  ?   Nose: Nose normal. No nasal deformity.  ?   Mouth/Throat:  ?   Lips: Pink.  ?   Mouth: Mucous  membranes are moist.  ?   Tongue: No lesions.  ?   Pharynx: Oropharynx is clear.  ?Eyes:  ?   Extraocular Movements: Extraocular movements intact.  ?   Pupils: Pupils are equal, round, and reactive to light.  ?Neck:  ?   Vascular: No carotid bruit.  ?Cardiovascular:  ?   Rate and Rhythm: Normal rate and regular rhythm.  ?   Pulses: Normal pulses.  ?   Heart sounds: Normal heart sounds.  ?Pulmonary:  ?   Effort: Pulmonary effort is normal.  ?   Breath sounds: Normal breath sounds.  ?Abdominal:  ?   General: Bowel sounds are normal. There is no distension.  ?   Palpations: Abdomen is soft. There is no mass.  ?   Tenderness: There is no abdominal tenderness. There is no guarding.  ?   Hernia: No hernia is present.  ?Musculoskeletal:  ?   Right lower leg: No  edema.  ?   Left lower leg: No edema.  ?Skin: ?   General: Skin is warm.  ?Neurological:  ?   General: No focal deficit present.  ?   Mental Status: She is alert and oriented to person, place, and time.  ?   Cranial Nerves: Cranial nerves 2-12 are intact.  ?   Motor: Motor function is intact.  ?Psychiatric:     ?   Attention and Perception: Attention normal.     ?   Mood and Affect: Mood normal.     ?   Speech: Speech normal.     ?   Behavior: Behavior normal. Behavior is cooperative.     ?   Cognition and Memory: Cognition normal.  ? ? ?Data Reviewed: ?Results for orders placed or performed during the hospital encounter of 03/05/22 (from the past 24 hour(s))  ?CBC with Differential     Status: Abnormal  ? Collection Time: 03/05/22  3:50 PM  ?Result Value Ref Range  ? WBC 12.2 (H) 4.0 - 10.5 K/uL  ? RBC 4.34 3.87 - 5.11 MIL/uL  ? Hemoglobin 13.2 12.0 - 15.0 g/dL  ? HCT 37.8 36.0 - 46.0 %  ? MCV 87.1 80.0 - 100.0 fL  ? MCH 30.4 26.0 - 34.0 pg  ? MCHC 34.9 30.0 - 36.0 g/dL  ? RDW 11.9 11.5 - 15.5 %  ? Platelets 385 150 - 400 K/uL  ? nRBC 0.0 0.0 - 0.2 %  ? Neutrophils Relative % 78 %  ? Neutro Abs 9.5 (H) 1.7 - 7.7 K/uL  ? Lymphocytes Relative 17 %  ? Lymphs Abs 2.1 0.7 - 4.0 K/uL  ? Monocytes Relative 5 %  ? Monocytes Absolute 0.6 0.1 - 1.0 K/uL  ? Eosinophils Relative 0 %  ? Eosinophils Absolute 0.0 0.0 - 0.5 K/uL  ? Basophils Relative 0 %  ? Basophils Absolute 0.0 0.0 - 0.1 K/uL  ? Immature Granulocytes 0 %  ? Abs Immature Granulocytes 0.04 0.00 - 0.07 K/uL  ?Basic metabolic panel     Status: Abnormal  ? Collection Time: 03/05/22  3:50 PM  ?Result Value Ref Range  ? Sodium 132 (L) 135 - 145 mmol/L  ? Potassium 3.0 (L) 3.5 - 5.1 mmol/L  ? Chloride 97 (L) 98 - 111 mmol/L  ? CO2 25 22 - 32 mmol/L  ? Glucose, Bld 97 70 - 99 mg/dL  ? BUN 6 6 - 20 mg/dL  ? Creatinine, Ser 0.50 0.44 - 1.00 mg/dL  ? Calcium 9.8 8.9 - 10.3  mg/dL  ? GFR, Estimated >60 >60 mL/min  ? Anion gap 10 5 - 15  ?Magnesium     Status: None  ?  Collection Time: 03/05/22  3:50 PM  ?Result Value Ref Range  ? Magnesium 2.1 1.7 - 2.4 mg/dL  ? ? ? ?Assessment and Plan: ?* Nausea and vomiting ?Differentials include cannabinoid hyperemesis syndrome versus hyperemesis gravidarum. ?Either way we will provide patient with supportive care and continue with IV Pepcid and metoclopramide. ?We will try patient on Carafate. ?Supportive care with maintenance IV fluid overnight. ? ?Hyperemesis gravidarum ?As above. ? ?Hypokalemia ?Replaced we will follow levels. ?Magnesium levels within normal limits. ? ? ?Advance Care Planning:   Code Status: Full Code  ? ?Consults:  ?None   ? ?Family Communication:  ?Pettiford,Jarell (Friend)  ?[phone] (Mobile) ? ?Severity of Illness: ?The appropriate patient status for this patient is OBSERVATION. Observation status is judged to be reasonable and necessary in order to provide the required intensity of service to ensure the patient's safety. The patient's presenting symptoms, physical exam findings, and initial radiographic and laboratory data in the context of their medical condition is felt to place them at decreased risk for further clinical deterioration. Furthermore, it is anticipated that the patient will be medically stable for discharge from the hospital within 2 midnights of admission.  ? ?Author: ?Para Skeans, MD ?03/06/2022 1:15 AM ? ?For on call review [email or web address].  ?

## 2022-03-06 DIAGNOSIS — E876 Hypokalemia: Secondary | ICD-10-CM

## 2022-03-06 DIAGNOSIS — R112 Nausea with vomiting, unspecified: Secondary | ICD-10-CM | POA: Diagnosis not present

## 2022-03-06 DIAGNOSIS — O210 Mild hyperemesis gravidarum: Secondary | ICD-10-CM | POA: Diagnosis not present

## 2022-03-06 LAB — HEPATIC FUNCTION PANEL
ALT: 17 U/L (ref 0–44)
AST: 12 U/L — ABNORMAL LOW (ref 15–41)
Albumin: 3.5 g/dL (ref 3.5–5.0)
Alkaline Phosphatase: 31 U/L — ABNORMAL LOW (ref 38–126)
Bilirubin, Direct: 0.2 mg/dL (ref 0.0–0.2)
Indirect Bilirubin: 0.4 mg/dL (ref 0.3–0.9)
Total Bilirubin: 0.6 mg/dL (ref 0.3–1.2)
Total Protein: 5.8 g/dL — ABNORMAL LOW (ref 6.5–8.1)

## 2022-03-06 LAB — BASIC METABOLIC PANEL
Anion gap: 6 (ref 5–15)
BUN: <5 mg/dL — ABNORMAL LOW (ref 6–20)
CO2: 21 mmol/L — ABNORMAL LOW (ref 22–32)
Calcium: 8.2 mg/dL — ABNORMAL LOW (ref 8.9–10.3)
Chloride: 105 mmol/L (ref 98–111)
Creatinine, Ser: 0.4 mg/dL — ABNORMAL LOW (ref 0.44–1.00)
GFR, Estimated: >60 mL/min (ref >60)
Glucose, Bld: 80 mg/dL (ref 70–99)
Potassium: 3.8 mmol/L (ref 3.5–5.1)
Sodium: 132 mmol/L — ABNORMAL LOW (ref 135–145)

## 2022-03-06 LAB — URINALYSIS, ROUTINE W REFLEX MICROSCOPIC
Bilirubin Urine: NEGATIVE
Glucose, UA: NEGATIVE mg/dL
Hgb urine dipstick: NEGATIVE
Ketones, ur: NEGATIVE mg/dL
Leukocytes,Ua: NEGATIVE
Nitrite: NEGATIVE
Protein, ur: NEGATIVE mg/dL
Specific Gravity, Urine: 1.018 (ref 1.005–1.030)
pH: 8 (ref 5.0–8.0)

## 2022-03-06 LAB — COMPREHENSIVE METABOLIC PANEL
ALT: 16 U/L (ref 0–44)
AST: 10 U/L — ABNORMAL LOW (ref 15–41)
Albumin: 3.4 g/dL — ABNORMAL LOW (ref 3.5–5.0)
Alkaline Phosphatase: 30 U/L — ABNORMAL LOW (ref 38–126)
Anion gap: 7 (ref 5–15)
BUN: 5 mg/dL — ABNORMAL LOW (ref 6–20)
CO2: 25 mmol/L (ref 22–32)
Calcium: 8.2 mg/dL — ABNORMAL LOW (ref 8.9–10.3)
Chloride: 101 mmol/L (ref 98–111)
Creatinine, Ser: 0.31 mg/dL — ABNORMAL LOW (ref 0.44–1.00)
GFR, Estimated: >60 mL/min (ref >60)
Glucose, Bld: 87 mg/dL (ref 70–99)
Potassium: 2.9 mmol/L — ABNORMAL LOW (ref 3.5–5.1)
Sodium: 133 mmol/L — ABNORMAL LOW (ref 135–145)
Total Bilirubin: 0.6 mg/dL (ref 0.3–1.2)
Total Protein: 5.8 g/dL — ABNORMAL LOW (ref 6.5–8.1)

## 2022-03-06 LAB — CBC
HCT: 30.8 % — ABNORMAL LOW (ref 36.0–46.0)
HCT: 29.6 % — ABNORMAL LOW (ref 36.0–46.0)
Hemoglobin: 10.8 g/dL — ABNORMAL LOW (ref 12.0–15.0)
Hemoglobin: 10.4 g/dL — ABNORMAL LOW (ref 12.0–15.0)
MCH: 31.5 pg (ref 26.0–34.0)
MCH: 31 pg (ref 26.0–34.0)
MCHC: 35.1 g/dL (ref 30.0–36.0)
MCHC: 35.1 g/dL (ref 30.0–36.0)
MCV: 89.8 fL (ref 80.0–100.0)
MCV: 88.1 fL (ref 80.0–100.0)
Platelets: 291 10*3/uL (ref 150–400)
Platelets: 293 10*3/uL (ref 150–400)
RBC: 3.43 MIL/uL — ABNORMAL LOW (ref 3.87–5.11)
RBC: 3.36 MIL/uL — ABNORMAL LOW (ref 3.87–5.11)
RDW: 12.1 % (ref 11.5–15.5)
RDW: 12.1 % (ref 11.5–15.5)
WBC: 12.8 10*3/uL — ABNORMAL HIGH (ref 4.0–10.5)
WBC: 11.4 10*3/uL — ABNORMAL HIGH (ref 4.0–10.5)
nRBC: 0 % (ref 0.0–0.2)
nRBC: 0 % (ref 0.0–0.2)

## 2022-03-06 LAB — URINE DRUG SCREEN, QUALITATIVE (ARMC ONLY)
Amphetamines, Ur Screen: NOT DETECTED
Barbiturates, Ur Screen: NOT DETECTED
Benzodiazepine, Ur Scrn: NOT DETECTED
Cannabinoid 50 Ng, Ur ~~LOC~~: POSITIVE — AB
Cocaine Metabolite,Ur ~~LOC~~: NOT DETECTED
MDMA (Ecstasy)Ur Screen: NOT DETECTED
Methadone Scn, Ur: NOT DETECTED
Opiate, Ur Screen: NOT DETECTED
Phencyclidine (PCP) Ur S: NOT DETECTED
Tricyclic, Ur Screen: NOT DETECTED

## 2022-03-06 LAB — MAGNESIUM: Magnesium: 2.3 mg/dL (ref 1.7–2.4)

## 2022-03-06 LAB — CREATININE, SERUM
Creatinine, Ser: 0.46 mg/dL (ref 0.44–1.00)
GFR, Estimated: >60 mL/min (ref >60)

## 2022-03-06 MED ORDER — SODIUM CHLORIDE 0.9 % IV SOLN
8.0000 mg | Freq: Three times a day (TID) | INTRAVENOUS | Status: DC | PRN
  Filled 2022-03-06: qty 4

## 2022-03-06 MED ORDER — POTASSIUM CHLORIDE CRYS ER 20 MEQ PO TBCR
40.0000 meq | EXTENDED_RELEASE_TABLET | Freq: Two times a day (BID) | ORAL | Status: AC
Start: 2022-03-06 — End: 2022-03-06
  Administered 2022-03-06 (×2): 40 meq via ORAL
  Filled 2022-03-06 (×2): qty 2

## 2022-03-06 MED ORDER — HEPARIN SODIUM (PORCINE) 5000 UNIT/ML IJ SOLN
5000.0000 [IU] | Freq: Two times a day (BID) | INTRAMUSCULAR | Status: DC
  Administered 2022-03-06 (×3): 5000 [IU] via SUBCUTANEOUS
  Filled 2022-03-06 (×4): qty 1

## 2022-03-06 MED ORDER — ONDANSETRON HCL 4 MG PO TABS
8.0000 mg | ORAL_TABLET | Freq: Three times a day (TID) | ORAL | Status: DC | PRN
  Administered 2022-03-06: 8 mg via ORAL
  Filled 2022-03-06: qty 2

## 2022-03-06 MED ORDER — SODIUM CHLORIDE 0.9% FLUSH
3.0000 mL | Freq: Two times a day (BID) | INTRAVENOUS | Status: DC
  Administered 2022-03-06: 3 mL via INTRAVENOUS

## 2022-03-06 MED ORDER — METOCLOPRAMIDE HCL 10 MG PO TABS
5.0000 mg | ORAL_TABLET | Freq: Three times a day (TID) | ORAL | Status: DC | PRN
  Administered 2022-03-06 – 2022-03-07 (×2): 5 mg via ORAL
  Filled 2022-03-06 (×2): qty 1

## 2022-03-06 MED ORDER — ACETAMINOPHEN 325 MG PO TABS
650.0000 mg | ORAL_TABLET | Freq: Four times a day (QID) | ORAL | Status: DC | PRN
  Administered 2022-03-06 – 2022-03-07 (×3): 650 mg via ORAL
  Filled 2022-03-06 (×3): qty 2

## 2022-03-06 MED ORDER — ONDANSETRON 4 MG PO TBDP
4.0000 mg | ORAL_TABLET | Freq: Three times a day (TID) | ORAL | Status: DC | PRN
  Administered 2022-03-06: 4 mg via ORAL
  Filled 2022-03-06: qty 1

## 2022-03-06 MED ORDER — CAPSAICIN 0.025 % EX CREA
TOPICAL_CREAM | Freq: Two times a day (BID) | CUTANEOUS | Status: DC
  Filled 2022-03-06: qty 60

## 2022-03-06 MED ORDER — SUCRALFATE 1 GM/10ML PO SUSP
1.0000 g | Freq: Three times a day (TID) | ORAL | Status: DC
  Administered 2022-03-06 – 2022-03-07 (×5): 1 g via ORAL
  Filled 2022-03-06 (×6): qty 10

## 2022-03-06 MED ORDER — METOCLOPRAMIDE HCL 5 MG/ML IJ SOLN
10.0000 mg | Freq: Once | INTRAMUSCULAR | Status: AC
  Administered 2022-03-06: 10 mg via INTRAVENOUS
  Filled 2022-03-06: qty 2

## 2022-03-06 MED ORDER — VITAMIN B-6 50 MG PO TABS
25.0000 mg | ORAL_TABLET | Freq: Every day | ORAL | Status: DC
  Administered 2022-03-06 – 2022-03-07 (×2): 25 mg via ORAL
  Filled 2022-03-06 (×2): qty 1

## 2022-03-06 MED ORDER — ACETAMINOPHEN 650 MG RE SUPP: 650.0000 mg | Freq: Four times a day (QID) | RECTAL | Status: DC | PRN

## 2022-03-06 MED ORDER — PRENATAL MULTIVITAMIN CH
1.0000 | ORAL_TABLET | Freq: Every day | ORAL | Status: DC
  Administered 2022-03-06 – 2022-03-07 (×2): 1 via ORAL
  Filled 2022-03-06 (×2): qty 1

## 2022-03-06 MED ORDER — FAMOTIDINE IN NACL 20-0.9 MG/50ML-% IV SOLN
20.0000 mg | Freq: Once | INTRAVENOUS | Status: AC
  Administered 2022-03-06: 20 mg via INTRAVENOUS
  Filled 2022-03-06: qty 50

## 2022-03-06 MED ORDER — PROCHLORPERAZINE MALEATE 10 MG PO TABS
10.0000 mg | ORAL_TABLET | Freq: Four times a day (QID) | ORAL | Status: DC | PRN
  Administered 2022-03-06 – 2022-03-07 (×4): 10 mg via ORAL
  Filled 2022-03-06 (×5): qty 1

## 2022-03-06 MED ORDER — DOXYLAMINE SUCCINATE (SLEEP) 25 MG PO TABS
25.0000 mg | ORAL_TABLET | Freq: Every day | ORAL | Status: DC
  Administered 2022-03-06: 25 mg via ORAL
  Filled 2022-03-06: qty 1

## 2022-03-06 MED ORDER — LACTATED RINGERS IV SOLN: INTRAVENOUS | Status: DC

## 2022-03-06 MED ORDER — POTASSIUM CHLORIDE IN NACL 40-0.9 MEQ/L-% IV SOLN
INTRAVENOUS | Status: DC
  Filled 2022-03-06 (×3): qty 1000

## 2022-03-06 NOTE — Progress Notes (Signed)
?PROGRESS NOTE ? ?Debra Murray    DOB: Aug 24, 1996, 26 y.o.  ?GYF:[id number]  ?  Code Status: Full Code   ?DOA: 03/05/2022   LOS: 0  ? ?Brief hospital course  ?Debra Murray is a 26 y.o. 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 ? ?They presented from home to the ED on 03/05/2022 with retractable N/V which has been ongoing and recurrent through her pregnancy. She was just discharged from the hospital 2 days prior for same. ? ?In the ED, it was found that they had N/V refractory to therapy.  ?Significant findings included significant hypokalemia- 3.0, Na+ 133, AST 10, WBC 12.8. ? ?They were treated with benadryl, reglan, sucralfate, Ivm fluids, pepcid, zofran, potassium supplements.  ?Patient was admitted to medicine service for further workup and management of retractable N/V as outlined in detail below. ? ?03/06/22 -stable, improved ? ?Assessment & Plan  ?Principal Problem: ?  Nausea and vomiting ?Active Problems: ?  Hyperemesis gravidarum ?  Hypokalemia ? ?Hyperemesis gravidarum- improved. Patient is tolerating breakfast this am. States that compazine seems to help her the most.  ?Multiple admissions for same and had reassuring workup for infant ?- continue IV fluids ?- OB consulted, appreciate recs ?- daily weights ?- anti emetics PRN ?- ginger lozenges ?- alcohol swabs for acute nausea ?- doxylamine-pyridoxine ?  ?Hypokalemia- worsening K+ 3.0>2.9. would expect to improve if tolerating a normal diet.  ?- changed fluids to increase K+ additive ?- PO replacement if can tolerate ?- repeat BMP this afternoon ?- replete PRN ? ?[id number] at [redacted]w[redacted]d- Estimated Date of Delivery: 10/10/22 ?- continue prenatal vitamins daily ?- OB following, appreciate recs ? ?There is no height or weight on file to calculate BMI. ? ?VTE ppx: heparin injection 5,000 Units Start: 03/06/22 0015 ?SCDs Start: 03/06/22 0009 ? ? ?Diet:  ?   ?Diet  ? Diet regular Room service  appropriate? Yes; Fluid consistency: Thin  ? ?Consultants: ?OB/gyn ?Subjective 03/06/22   ? ?Pt reports feeling improved. Mild nausea but able to tolerate breakfast. Denies abdominal pain, leakage of fluids, or bleeding. ?  ?Objective  ? ?Vitals:  ? 03/06/22 0225 03/06/22 0539 03/06/22 0844 03/06/22 1138  ?BP: 119/61 112/71 121/62 121/67  ?Pulse: 91 81 74 93  ?Resp: 17 18 20 18   ?Temp: 98.5 ?F (36.9 ?C) 98.4 ?F (36.9 ?C) 97.7 ?F (36.5 ?C) 98.6 ?F (37 ?C)  ?TempSrc: Oral Oral Oral Oral  ?SpO2: 100% 100% 100%   ? ? ?Intake/Output Summary (Last 24 hours) at 03/06/2022 1238 ?Last data filed at 03/06/2022 1159 ?Gross per 24 hour  ?Intake 2740 ml  ?Output 1500 ml  ?Net 1240 ml  ? ?There were no vitals filed for this visit.  ? ?Physical Exam:  ?General: awake, alert, NAD ?HEENT: atraumatic, clear conjunctiva, anicteric sclera, MMM, hearing grossly normal ?Respiratory: normal respiratory effort. ?Cardiovascular: quick capillary refill, normal S1/S2, RRR, no JVD, murmurs ?Gastrointestinal: soft, NT, ND ?Nervous: A&O x3. no gross focal neurologic deficits, normal speech ?Extremities: moves all equally, no edema, normal tone ?Skin: dry, intact, normal temperature, normal color. No rashes, lesions or ulcers on exposed skin ?Psychiatry: normal mood, congruent affect ? ?Labs   ?I have personally reviewed the following labs and imaging studies ?CBC ?   ?Component Value Date/Time  ? WBC 11.4 (H) 03/06/2022 0530  ? RBC 3.36 (L) 03/06/2022 0530  ? HGB 10.4 (L) 03/06/2022 0530  ? HCT 29.6 (L) 03/06/2022 0530  ? PLT  293 03/06/2022 0530  ? MCV 88.1 03/06/2022 0530  ? MCH 31.0 03/06/2022 0530  ? MCHC 35.1 03/06/2022 0530  ? RDW 12.1 03/06/2022 0530  ? LYMPHSABS 2.1 03/05/2022 1550  ? MONOABS 0.6 03/05/2022 1550  ? EOSABS 0.0 03/05/2022 1550  ? BASOSABS 0.0 03/05/2022 1550  ? ? ?  Latest Ref Rng & Units 03/06/2022  ?  5:30 AM 03/06/2022  ?  1:55 AM 03/05/2022  ?  3:50 PM  ?BMP  ?Glucose 70 - 99 mg/dL 87    97    ?BUN 6 - 20 mg/dL 5    6     ?Creatinine 0.44 - 1.00 mg/dL 7.20   9.47   0.96    ?Sodium 135 - 145 mmol/L 133    132    ?Potassium 3.5 - 5.1 mmol/L 2.9    3.0    ?Chloride 98 - 111 mmol/L 101    97    ?CO2 22 - 32 mmol/L 25    25    ?Calcium 8.9 - 10.3 mg/dL 8.2    9.8    ? ? ?No results found. ? ?Disposition Plan & Communication  ?Patient status: Observation  ?Admitted From: Home ?Planned disposition location: Home ?Anticipated discharge date: 4/30 pending improvement in symptoms ? ?Family Communication: none  ?  ?Author: ?Leeroy Bock, DO ?[facility] ?03/06/2022, 12:38 PM  ? ?Available by Epic secure chat 7AM-7PM. ?If 7PM-7AM, please contact night-coverage.  ?[facility] contact information found on [email or web address]. ? ?

## 2022-03-06 NOTE — Assessment & Plan Note (Signed)
Differentials include cannabinoid hyperemesis syndrome versus hyperemesis gravidarum. ?Either way we will provide patient with supportive care and continue with IV Pepcid and metoclopramide. ?We will try patient on Carafate. ?Supportive care with maintenance IV fluid overnight. ?

## 2022-03-06 NOTE — Assessment & Plan Note (Signed)
Replaced we will follow levels. ?Magnesium levels within normal limits. ?

## 2022-03-06 NOTE — Assessment & Plan Note (Signed)
As above.

## 2022-03-07 DIAGNOSIS — E876 Hypokalemia: Secondary | ICD-10-CM | POA: Diagnosis not present

## 2022-03-07 DIAGNOSIS — O210 Mild hyperemesis gravidarum: Secondary | ICD-10-CM | POA: Diagnosis not present

## 2022-03-07 LAB — BASIC METABOLIC PANEL
Anion gap: 7 (ref 5–15)
BUN: <5 mg/dL — ABNORMAL LOW (ref 6–20)
CO2: 20 mmol/L — ABNORMAL LOW (ref 22–32)
Calcium: 8.8 mg/dL — ABNORMAL LOW (ref 8.9–10.3)
Chloride: 106 mmol/L (ref 98–111)
Creatinine, Ser: 0.45 mg/dL (ref 0.44–1.00)
GFR, Estimated: >60 mL/min (ref >60)
Glucose, Bld: 128 mg/dL — ABNORMAL HIGH (ref 70–99)
Potassium: 3.5 mmol/L (ref 3.5–5.1)
Sodium: 133 mmol/L — ABNORMAL LOW (ref 135–145)

## 2022-03-07 MED ORDER — ONDANSETRON HCL 8 MG PO TABS: 8.0000 mg | ORAL_TABLET | Freq: Three times a day (TID) | ORAL | 0 refills | Status: AC | PRN

## 2022-03-07 MED ORDER — PROCHLORPERAZINE MALEATE 10 MG PO TABS: 10.0000 mg | ORAL_TABLET | Freq: Four times a day (QID) | ORAL | 0 refills | Status: AC | PRN

## 2022-03-07 MED ORDER — DOXYLAMINE SUCCINATE (SLEEP) 25 MG PO TABS
25.0000 mg | ORAL_TABLET | Freq: Two times a day (BID) | ORAL | 0 refills | Status: AC
Start: 2022-03-07 — End: 2022-04-06

## 2022-03-07 MED ORDER — PYRIDOXINE HCL 25 MG PO TABS: 25.0000 mg | ORAL_TABLET | Freq: Every day | ORAL | 0 refills | Status: AC

## 2022-03-07 NOTE — Discharge Summary (Signed)
? ? ?Physician Discharge Summary  ?Patient: Debra Murray OIL:[id number] DOB: 05/22/1996   Code Status: Prior ?Admit date: 03/05/2022 ?Discharge date: 03/07/2022 ?Disposition: Home, No home health services recommended ?PCP: Pcp, No ? ?Recommendations for Outpatient Follow-up:  ?Follow up with PCP within 1 week to recheck potassium level ?Follow up with prenatal appointment as soon as possible ? ?Discharge Diagnoses:  ?Principal Problem: ?  Nausea and vomiting ?Active Problems: ?  Hyperemesis gravidarum ?  Hypokalemia ? ?Brief Hospital Course Summary: ?Debra Murray is a 26 y.o. 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 ?  ?They presented from home to the ED on 03/05/2022 with retractable N/V which has been ongoing and recurrent through her pregnancy. She was just discharged from the hospital 2 days prior for same with extensive workup which was unremarkable. She denied any vaginal bleeding, leakage of fluids or contractions. ?  ?In the ED, it was found that they had N/V refractory to therapy.  ?Significant findings included significant hypokalemia- 3.0, Na+ 133, AST 10, WBC 12.8. Fortunately, her vitals were stable.  ?  ?They were initially treated with benadryl, reglan, sucralfate, Ivm fluids, pepcid, zofran, potassium supplements.  ? ?Patient endorsed good response to compazine as well as smelling alcohol swabs. She was able to tolerate a normal diet prior to discharge and had normalized potassium.  ? ?Discharge Condition: Good, improved ?Recommended discharge diet: Regular healthy diet ? ?Consultations: ?OB/gyn ? ?Procedures/Studies: ?None  ? ?Allergies as of 03/07/2022   ?No Known Allergies ?  ? ?  ?Medication List  ?  ? ?STOP taking these medications   ? ?ondansetron 4 MG disintegrating tablet ?Commonly known as: ZOFRAN-ODT ?  ?potassium chloride SA 20 MEQ tablet ?Commonly known as: KLOR-CON M ?  ?predniSONE 10 MG tablet ?Commonly known as:  DELTASONE ?  ? ?  ? ?TAKE these medications   ? ?doxylamine (Sleep) 25 MG tablet ?Commonly known as: UNISOM ?Take 1 tablet (25 mg total) by mouth 2 (two) times daily. ?  ?ondansetron 8 MG tablet ?Commonly known as: ZOFRAN ?Take 1 tablet (8 mg total) by mouth every 8 (eight) hours as needed for nausea or vomiting. ?  ?Prenatal Vitamin 27-0.8 MG Tabs ?Take 1 tablet by mouth daily. ?  ?prochlorperazine 10 MG tablet ?Commonly known as: COMPAZINE ?Take 1 tablet (10 mg total) by mouth every 6 (six) hours as needed for nausea or vomiting. ?  ?pyridOXINE 25 MG tablet ?Commonly known as: VITAMIN B-6 ?Take 1 tablet (25 mg total) by mouth daily. ?Start taking on: Mar 08, 2022 ?  ? ?  ? ? ? ?Subjective   ?Pt reports feeling much improved. Endorses good tolerance of diet when taking her antiemetics. She endorses understanding of needing to get her potassium monitored within a week.  Return precautions were discussed. Several antiemetic therapies and at home remedies were discussed and prescribed.  ? ?Objective  ?Blood pressure 118/68, pulse 91, temperature 98 ?F (36.7 ?C), temperature source Oral, resp. rate 18, weight 78.3 kg, last menstrual period 01/02/2022, SpO2 100 %.  ? ?General: Pt is alert, awake, not in acute distress ?Cardiovascular: RRR, S1/S2 +, no rubs, no gallops ?Respiratory: CTA bilaterally, no wheezing, no rhonchi ?Abdominal: Soft, NT, ND, bowel sounds + ?Extremities: no edema, no cyanosis ? ? ?The results of significant diagnostics from this hospitalization (including imaging, microbiology, ancillary and laboratory) are listed below for reference.  ? ?Imaging studies: ?MR PELVIS WO CONTRAST ? ?Result Date:  02/27/2022 ?CLINICAL DATA:  Pregnant, right lower quadrant abdominal pain EXAM: MRI ABDOMEN AND PELVIS WITHOUT CONTRAST TECHNIQUE: Multiplanar multisequence MR imaging of the abdomen and pelvis was performed. No intravenous contrast was administered. COMPARISON:  OB ultrasound dated 02/27/2022 FINDINGS: Lower  chest: Lung bases are clear. Hepatobiliary: Liver is within normal limits. Gallbladder is unremarkable. No intrahepatic or extrahepatic ductal dilatation. Pancreas:  Within normal limits. Spleen:  Within normal limits. Adrenals/Urinary Tract:  Adrenal glands are within normal limits. Kidneys are within normal limits.  No hydronephrosis. Bladder is within normal limits. Stomach/Bowel: Stomach is within normal limits. No evidence of bowel obstruction. Normal appendix (series 9/image 71). No colonic wall thickening or inflammatory changes. Vascular/Lymphatic:  No evidence of abdominal aortic aneurysm. No suspicious abdominopelvic lymphadenopathy. Reproductive: Early gravid uterus, better evaluated on recent OB ultrasound. Left ovary is within normal limits. 2.7 cm right ovarian corpus luteum (series 9/image 86), physiologic/benign. No follow-up recommended. Other:  Small volume pelvic ascites, likely physiologic. Musculoskeletal: No focal osseous lesions. IMPRESSION: Normal appendix. Early gravid uterus, better evaluated on recent OB ultrasound. 2.7 cm right ovarian corpus luteum, physiologic/benign. No follow-up recommended. Electronically Signed   By: Charline BillsSriyesh  Krishnan M.D.   On: 02/27/2022 19:09  ? ?MR ABDOMEN WO CONTRAST ? ?Result Date: 02/27/2022 ?CLINICAL DATA:  Pregnant, right lower quadrant abdominal pain EXAM: MRI ABDOMEN AND PELVIS WITHOUT CONTRAST TECHNIQUE: Multiplanar multisequence MR imaging of the abdomen and pelvis was performed. No intravenous contrast was administered. COMPARISON:  OB ultrasound dated 02/27/2022 FINDINGS: Lower chest: Lung bases are clear. Hepatobiliary: Liver is within normal limits. Gallbladder is unremarkable. No intrahepatic or extrahepatic ductal dilatation. Pancreas:  Within normal limits. Spleen:  Within normal limits. Adrenals/Urinary Tract:  Adrenal glands are within normal limits. Kidneys are within normal limits.  No hydronephrosis. Bladder is within normal limits.  Stomach/Bowel: Stomach is within normal limits. No evidence of bowel obstruction. Normal appendix (series 9/image 71). No colonic wall thickening or inflammatory changes. Vascular/Lymphatic:  No evidence of abdominal aortic aneurysm. No suspicious abdominopelvic lymphadenopathy. Reproductive: Early gravid uterus, better evaluated on recent OB ultrasound. Left ovary is within normal limits. 2.7 cm right ovarian corpus luteum (series 9/image 86), physiologic/benign. No follow-up recommended. Other:  Small volume pelvic ascites, likely physiologic. Musculoskeletal: No focal osseous lesions. IMPRESSION: Normal appendix. Early gravid uterus, better evaluated on recent OB ultrasound. 2.7 cm right ovarian corpus luteum, physiologic/benign. No follow-up recommended. Electronically Signed   By: Charline BillsSriyesh  Krishnan M.D.   On: 02/27/2022 19:09  ? ?US OB Comp Less 14 Wks ? ?Result Date: 02/27/2022 ?CLINICAL DATA:  Right lower quadrant pain since this morning. EXAM: OBSTETRIC <14 WK ULTRASOUND TECHNIQUE: Transabdominal ultrasound was performed for evaluation of the gestation as well as the maternal uterus and adnexal regions. COMPARISON:  March 05, 2022 FINDINGS: Intrauterine gestational sac: Single Yolk sac:  Visualized. Embryo:  Visualized. Cardiac Activity: Visualized. Heart Rate: 169 bpm MSD:    mm    w     d CRL:   16.0 mm   8 w 0 d                  US EDC: 10/09/2022 Subchorionic hemorrhage:  None visualized. Maternal uterus/adnexae: There is a complicated cystic structure of the right ovary measuring 2.5 x 2.2 x 1.9 cm. Normal left ovary. IMPRESSION: 1. Single live intrauterine pregnancy corresponding to 8 weeks 0 days gestation, without complicating features. 2. Stable complicated cystic structure on the right ovary measuring 2.5 cm, query corpus luteum versus hemorrhagic  cyst. While right ovarian ectopic pregnancy is on the differential diagnosis, it is statistically exceedingly rare. Clinical and imaging follow-up is  recommended. Electronically Signed   By: Ted Mcalpine M.D.   On: 02/27/2022 12:29  ? ?US OB LESS THAN 14 WEEKS WITH OB TRANSVAGINAL ? ?Result Date: 02/25/2022 ?CLINICAL DATA:  Vomiting for 4 days EXAM: OBSTETR

## 2022-03-07 NOTE — Discharge Instructions (Signed)
Please continue to take of potassium daily and get your potassium checked again in about 1 week.  ?I recommend having a few pretzels in bed prior to getting up and continuing to have a few every 30-60 minutes throughout the day. ?Ginger lozenges, drinks, popcicles, etc can help ?Can smell alcohol swabs if acutely nauseas  ?Continue to take zofran and compazine as needed for nausea ?Take doxylamine and pyridoxine as directed to prevent nausea.  ?Keep your primary OB appointment ?

## 2022-03-07 NOTE — Progress Notes (Signed)
Patient educated on discharge instructions, medications and follow up appointments. Patient verbalized understanding. Patient will be escorted out by staff.  ?

## 2022-03-09 ENCOUNTER — Other Ambulatory Visit: Payer: Self-pay

## 2022-03-09 ENCOUNTER — Encounter: Payer: Self-pay | Admitting: Emergency Medicine

## 2022-03-09 DIAGNOSIS — R531 Weakness: Secondary | ICD-10-CM | POA: Insufficient documentation

## 2022-03-09 DIAGNOSIS — Z3A09 9 weeks gestation of pregnancy: Secondary | ICD-10-CM | POA: Insufficient documentation

## 2022-03-09 DIAGNOSIS — K5900 Constipation, unspecified: Secondary | ICD-10-CM | POA: Diagnosis not present

## 2022-03-09 DIAGNOSIS — J45909 Unspecified asthma, uncomplicated: Secondary | ICD-10-CM | POA: Diagnosis not present

## 2022-03-09 DIAGNOSIS — O0289 Other abnormal products of conception: Secondary | ICD-10-CM | POA: Diagnosis not present

## 2022-03-09 DIAGNOSIS — E876 Hypokalemia: Secondary | ICD-10-CM | POA: Insufficient documentation

## 2022-03-09 DIAGNOSIS — O219 Vomiting of pregnancy, unspecified: Secondary | ICD-10-CM | POA: Insufficient documentation

## 2022-03-09 LAB — CBC WITH DIFFERENTIAL/PLATELET
Abs Immature Granulocytes: 0.05 10*3/uL (ref 0.00–0.07)
Basophils Absolute: 0 10*3/uL (ref 0.0–0.1)
Basophils Relative: 0 %
Eosinophils Absolute: 0 10*3/uL (ref 0.0–0.5)
Eosinophils Relative: 0 %
HCT: 37 % (ref 36.0–46.0)
Hemoglobin: 12.5 g/dL (ref 12.0–15.0)
Immature Granulocytes: 0 %
Lymphocytes Relative: 25 %
Lymphs Abs: 2.9 10*3/uL (ref 0.7–4.0)
MCH: 30.2 pg (ref 26.0–34.0)
MCHC: 33.8 g/dL (ref 30.0–36.0)
MCV: 89.4 fL (ref 80.0–100.0)
Monocytes Absolute: 0.8 10*3/uL (ref 0.1–1.0)
Monocytes Relative: 7 %
Neutro Abs: 8.1 10*3/uL — ABNORMAL HIGH (ref 1.7–7.7)
Neutrophils Relative %: 68 %
Platelets: 352 10*3/uL (ref 150–400)
RBC: 4.14 MIL/uL (ref 3.87–5.11)
RDW: 12.1 % (ref 11.5–15.5)
WBC: 11.9 10*3/uL — ABNORMAL HIGH (ref 4.0–10.5)
nRBC: 0 % (ref 0.0–0.2)

## 2022-03-09 LAB — COMPREHENSIVE METABOLIC PANEL
ALT: 19 U/L (ref 0–44)
AST: 15 U/L (ref 15–41)
Albumin: 4.3 g/dL (ref 3.5–5.0)
Alkaline Phosphatase: 39 U/L (ref 38–126)
Anion gap: 9 (ref 5–15)
BUN: 7 mg/dL (ref 6–20)
CO2: 31 mmol/L (ref 22–32)
Calcium: 10.5 mg/dL — ABNORMAL HIGH (ref 8.9–10.3)
Chloride: 97 mmol/L — ABNORMAL LOW (ref 98–111)
Creatinine, Ser: 0.55 mg/dL (ref 0.44–1.00)
GFR, Estimated: >60 mL/min (ref >60)
Glucose, Bld: 111 mg/dL — ABNORMAL HIGH (ref 70–99)
Potassium: 3.2 mmol/L — ABNORMAL LOW (ref 3.5–5.1)
Sodium: 137 mmol/L (ref 135–145)
Total Bilirubin: 0.5 mg/dL (ref 0.3–1.2)
Total Protein: 7.4 g/dL (ref 6.5–8.1)

## 2022-03-09 LAB — LIPASE, BLOOD: Lipase: 28 U/L (ref 11–51)

## 2022-03-09 LAB — HCG, QUANTITATIVE, PREGNANCY: hCG, Beta Chain, Quant, S: 130490 m[IU]/mL — ABNORMAL HIGH (ref <5)

## 2022-03-09 NOTE — ED Provider Triage Note (Signed)
Emergency Medicine Provider Triage Evaluation Note ? ?Debra Murray , a 26 y.o. female  was evaluated in triage.  Pt complains of ongoing emesis in the setting of pregnancy.  Patient has been seen in this hospital multiple times but having been admitted multiple times over the past several weeks.  Patient is pregnant, who is also with regular marijuana user.  Patient is complaining of ongoing nausea and vomiting..  Denies abdominal pain.  She states that she became nauseated and lightheaded earlier today prompting her to return.  On her last visit patient was also hypokalemic. ? ?Review of Systems  ?Positive: Vomiting in the setting of pregnancy ?Negative: Fevers, chills, abdominal pain, vaginal bleeding or discharge ? ?Physical Exam  ?BP 122/77 (BP Location: Left Arm)   Pulse 97   Temp 98.5 ?F (36.9 ?C) (Oral)   Resp 16   Ht 6\' 1"  (1.854 m)   Wt 77.1 kg   LMP 01/02/2022   SpO2 97%   BMI 22.43 kg/m?  ?Gen:   Awake, no distress   ?Resp:  Normal effort  ?MSK:   Moves extremities without difficulty  ?Other:   ? ?Medical Decision Making  ?Medically screening exam initiated at 9:54 PM.  Appropriate orders placed.  Debra Murray was informed that the remainder of the evaluation will be completed by another provider, this initial triage assessment does not replace that evaluation, and the importance of remaining in the ED until their evaluation is complete. ? ?Patient returns to the ED for ongoing nausea and vomiting.   Patient will have labs at this time. ?  ?Debra Moll, PA-C ?03/09/22 2157 ? ?

## 2022-03-09 NOTE — ED Triage Notes (Signed)
Pt to ED from home c/o n/v for a couple days.  States sent home from work today because she stood up and got light headed.  Pt states was discharged Sunday for same and hypokalemia.  Pt is [redacted]wks pregnant, unable to keep anti nausea meds or anything oral down at home.  Pt A&Ox4, chest rise even and unlabored, skin WNL and in NAD at this time. ?

## 2022-03-10 ENCOUNTER — Emergency Department
Admission: EM | Admit: 2022-03-10 | Discharge: 2022-03-10 | Disposition: A | Discharge status: Home / Self Care | Payer: Medicaid Other | Attending: Emergency Medicine | Admitting: Emergency Medicine

## 2022-03-10 DIAGNOSIS — R112 Nausea with vomiting, unspecified: Secondary | ICD-10-CM

## 2022-03-10 LAB — MAGNESIUM: Magnesium: 2 mg/dL (ref 1.7–2.4)

## 2022-03-10 MED ORDER — ONDANSETRON HCL 4 MG/2ML IJ SOLN
4.0000 mg | Freq: Once | INTRAMUSCULAR | Status: AC
  Administered 2022-03-10: 4 mg via INTRAVENOUS
  Filled 2022-03-10: qty 2

## 2022-03-10 MED ORDER — METOCLOPRAMIDE HCL 5 MG/ML IJ SOLN
10.0000 mg | Freq: Once | INTRAMUSCULAR | Status: AC
Start: 2022-03-10 — End: 2022-03-10
  Administered 2022-03-10: 10 mg via INTRAVENOUS
  Filled 2022-03-10: qty 2

## 2022-03-10 MED ORDER — DEXTROSE-NACL 5-0.9 % IV SOLN: INTRAVENOUS | Status: DC

## 2022-03-10 MED ORDER — ONDANSETRON 4 MG PO TBDP: 4.0000 mg | ORAL_TABLET | Freq: Four times a day (QID) | ORAL | 0 refills | Status: AC | PRN

## 2022-03-10 MED ORDER — POTASSIUM CHLORIDE CRYS ER 20 MEQ PO TBCR
40.0000 meq | EXTENDED_RELEASE_TABLET | Freq: Once | ORAL | Status: AC
  Administered 2022-03-10: 40 meq via ORAL
  Filled 2022-03-10: qty 2

## 2022-03-10 NOTE — Discharge Instructions (Addendum)
Steps to find a Primary Care Provider (PCP):  Call [phone] or [phone] to access "[facility] Find a Doctor Service."  2.  You may also go on the [facility] website at [email or web address]  

## 2022-03-10 NOTE — ED Provider Notes (Signed)
? ? [facility] ?Provider Note ? ? ? Event Date/Time  ? First MD Initiated Contact with Patient 03/10/22 0216   ?  (approximate) ? ? ?History  ? ?Vomiting ? ? ?HPI ? ?Debra Murray is a 26 y.o. female G3 P2 approximately [redacted] weeks pregnant with history of asthma and hyperemesis gravidarum who presents to the emergency department with complaints of nausea, vomiting that started on Monday, 2 days ago.  She is frequently admitted to the hospital here at [facility] for similar symptoms.  When asked what helps her feel better she states "coming into the hospital for 2 to 3 days".  No diarrhea.  She states she has been constipated.  No abdominal pain.  No vaginal bleeding, discharge, dysuria or hematuria.  [facility] but has an appointment scheduled.  Patient states that she feels very weak and thinks that her potassium level is low.  She is requesting admission to the hospital. ? ? ?History provided by patient. ? ? ? ?Past Medical History:  ?Diagnosis Date  ? Asthma   ? ? ?Past Surgical History:  ?Procedure Laterality Date  ? TONSILLECTOMY    ? as a child  ? ? ?MEDICATIONS:  ?Prior to Admission medications   ?Medication Sig Start Date End Date Taking? Authorizing Provider  ?doxylamine, Sleep, (UNISOM) 25 MG tablet Take 1 tablet (25 mg total) by mouth 2 (two) times daily. 03/07/22 04/06/22  Leeroy BockAnderson, Chelsey L, MD  ?ondansetron (ZOFRAN) 8 MG tablet Take 1 tablet (8 mg total) by mouth every 8 (eight) hours as needed for nausea or vomiting. 03/07/22 04/06/22  Leeroy BockAnderson, Chelsey L, MD  ?Prenatal Vit-Fe Fumarate-FA (PRENATAL VITAMIN) 27-0.8 MG TABS Take 1 tablet by mouth daily. 02/28/22   Enedina FinnerPatel, Sona, MD  ?prochlorperazine (COMPAZINE) 10 MG tablet Take 1 tablet (10 mg total) by mouth every 6 (six) hours as needed for nausea or vomiting. 03/07/22 04/06/22  Leeroy BockAnderson, Chelsey L, MD  ?pyridOXINE (VITAMIN B-6) 25 MG tablet Take 1 tablet (25 mg total) by mouth daily. 03/08/22  04/07/22  Leeroy BockAnderson, Chelsey L, MD  ? ? ?Physical Exam  ? ?Triage Vital Signs: ?ED Triage Vitals [03/09/22 2149]  ?Enc Vitals Group  ?   BP 122/77  ?   Pulse Rate 97  ?   Resp 16  ?   Temp 98.5 ?F (36.9 ?C)  ?   Temp Source Oral  ?   SpO2 97 %  ?   Weight 170 lb (77.1 kg)  ?   Height 6\' 1"  (1.854 m)  ?   Head Circumference   ?   Peak Flow   ?   Pain Score 0  ?   Pain Loc   ?   Pain Edu?   ?   Excl. in GC?   ? ? ?Most recent vital signs: ?Vitals:  ? 03/09/22 2149 03/10/22 0230  ?BP: 122/77 114/74  ?Pulse: 97 65  ?Resp: 16 18  ?Temp: 98.5 ?F (36.9 ?C)   ?SpO2: 97% 98%  ? ? ?CONSTITUTIONAL: Alert and oriented and responds appropriately to questions. Well-appearing; well-nourished, spitting into an emesis bag but no active emesis ?HEAD: Normocephalic, atraumatic ?EYES: Conjunctivae clear, pupils appear equal, sclera nonicteric ?ENT: normal nose; moist mucous membranes ?NECK: Supple, normal ROM ?CARD: RRR; S1 and S2 appreciated; no murmurs, no clicks, no rubs, no gallops ?RESP: Normal chest excursion without splinting or tachypnea; breath sounds clear and equal bilaterally; no wheezes, no rhonchi, no  rales, no hypoxia or respiratory distress, speaking full sentences ?ABD/GI: Normal bowel sounds; non-distended; soft, non-tender, no rebound, no guarding, no peritoneal signs ?BACK: The back appears normal ?EXT: Normal ROM in all joints; no deformity noted, no edema; no cyanosis ?SKIN: Normal color for age and race; warm; no rash on exposed skin ?NEURO: Moves all extremities equally, normal speech ?PSYCH: The patient's mood and manner are appropriate. ? ? ?ED Results / Procedures / Treatments  ? ?LABS: ?(all labs ordered are listed, but only abnormal results are displayed) ?Labs Reviewed  ?CBC WITH DIFFERENTIAL/PLATELET - Abnormal; Notable for the following components:  ?    Result Value  ? WBC 11.9 (*)   ? Neutro Abs 8.1 (*)   ? All other components within normal limits  ?COMPREHENSIVE METABOLIC PANEL - Abnormal; Notable for  the following components:  ? Potassium 3.2 (*)   ? Chloride 97 (*)   ? Glucose, Bld 111 (*)   ? Calcium 10.5 (*)   ? All other components within normal limits  ?HCG, QUANTITATIVE, PREGNANCY - Abnormal; Notable for the following components:  ? hCG, Beta Chain, Quant, S 130,490 (*)   ? All other components within normal limits  ?LIPASE, BLOOD  ?MAGNESIUM  ?URINALYSIS, ROUTINE W REFLEX MICROSCOPIC  ? ? ? ?EKG: ? ? ?RADIOLOGY: ?My personal review and interpretation of imaging:   ? ?I have personally reviewed all radiology reports.   ?No results found. ? ? ?PROCEDURES: ? ?Critical Care performed: No ? ? ? ? ?Procedures ? ? ? ?IMPRESSION / MDM / ASSESSMENT AND PLAN / ED COURSE  ?I reviewed the triage vital signs and the nursing notes. ? ? ? ?Patient here with complaints of nausea and vomiting for the past 2 days with history of hyperemesis gravidarum. ? ? ? ? ?DIFFERENTIAL DIAGNOSIS (includes but not limited to):   Hyperemesis gravidarum, dehydration, UTI, electrolyte derangement, doubt appendicitis, colitis, bowel obstruction, diverticulitis, cholecystitis, pancreatitis based on her benign exam ? ? ?PLAN: We will obtain CBC, CMP, lipase, magnesium level, urinalysis.  Will give IV fluids, Reglan.  Patient is requesting something to eat. ? ? ?MEDICATIONS GIVEN IN ED: ?Medications  ?dextrose 5 %-0.9 % sodium chloride infusion ( Intravenous New Bag/Given 03/10/22 0212)  ?metoCLOPramide (REGLAN) injection 10 mg (10 mg Intravenous Given 03/10/22 0213)  ?potassium chloride SA (KLOR-CON M) CR tablet 40 mEq (40 mEq Oral Given 03/10/22 0300)  ?ondansetron (ZOFRAN) injection 4 mg (4 mg Intravenous Given 03/10/22 0300)  ? ? ? ?ED COURSE: Patient's labs show mild leukocytosis which appears chronic for her.  Normal hemoglobin.  Potassium of 3.2.  Given oral replacement.  Normal glucose, LFTs.  hCG is 130,000.  Lipase normal.  Magnesium level normal.  Urine pending.  I did discuss the case with Dr. Para March on-call with the hospitalist service.   She states that patient is frequently admitted to the hospital and one of the last time she was here she was only admitted for about 3 to 4 hours before she was discharged.  Given the patient has not been actively vomiting here in the ED and only spitting into an emesis bag and is currently eating and drinking with a benign abdominal exam, Dr. Para March declines admission at this time.  I feel this is very reasonable.  Discussed this with the patient who verbalized understanding.  We will continue antiemetics and IV fluids as needed and reassess in the morning for further disposition.  Patient states she is still nauseated.  Will give dose  of Zofran. ? ? ? ?5:00 AM  Pt is sleeping.  She has been able to eat and drink without any difficulty here and has not had any witnessed emesis since being back in bed in the emergency department.  She has received IV hydration here.  Her vitals are normal.  She will be discharged with close PCP and OB follow-up and a prescription of Zofran. ? ? ?At this time, I do not feel there is any life-threatening condition present. I reviewed all nursing notes, vitals, pertinent previous records.  All lab and urine results, EKGs, imaging ordered have been independently reviewed and interpreted by myself.  I reviewed all available radiology reports from any imaging ordered this visit.  Based on my assessment, I feel the patient is safe to be discharged home without further emergent workup and can continue workup as an outpatient as needed. Discussed all findings, treatment plan as well as usual and customary return precautions with patient.  They verbalize understanding and are comfortable with this plan.  Outpatient follow-up has been provided as needed.  All questions have been answered. ? ? ?CONSULTS: Dr. Para March with hospitalist service consulted who declines admission at this time. ? ? ?OUTSIDE RECORDS REVIEWED: Reviewed patient's most recent admission to the hospital on 03/05/2022 to  03/07/2022. ? ? ? ? ? ? ? ? ?FINAL CLINICAL IMPRESSION(S) / ED DIAGNOSES  ? ?Final diagnoses:  ?Nausea and vomiting in adult  ? ? ? ?Rx / DC Orders  ? ?ED Discharge Orders   ? ?      Ordered  ?  ondansetron (Z

## 2022-03-10 NOTE — ED Notes (Signed)
Pt tolerated sandwich box and 2 cups of apple juice.  ?

## 2022-03-10 NOTE — ED Notes (Signed)
Pt provided a sandwich box and apple juice.  ?

## 2022-03-11 ENCOUNTER — Other Ambulatory Visit: Payer: Self-pay

## 2022-03-11 ENCOUNTER — Inpatient Hospital Stay
Admission: EM | Admit: 2022-03-11 | Discharge: 2022-03-13 | DRG: 833 | Disposition: A | Discharge status: Home / Self Care | Payer: Medicaid Other | Attending: Certified Nurse Midwife | Admitting: Certified Nurse Midwife

## 2022-03-11 DIAGNOSIS — O3680X0 Pregnancy with inconclusive fetal viability, not applicable or unspecified: Secondary | ICD-10-CM

## 2022-03-11 DIAGNOSIS — O211 Hyperemesis gravidarum with metabolic disturbance: Principal | ICD-10-CM | POA: Diagnosis present

## 2022-03-11 DIAGNOSIS — O210 Mild hyperemesis gravidarum: Secondary | ICD-10-CM | POA: Diagnosis present

## 2022-03-11 DIAGNOSIS — Z3A09 9 weeks gestation of pregnancy: Secondary | ICD-10-CM

## 2022-03-11 LAB — COMPREHENSIVE METABOLIC PANEL
ALT: 32 U/L (ref 0–44)
AST: 20 U/L (ref 15–41)
Albumin: 4.1 g/dL (ref 3.5–5.0)
Alkaline Phosphatase: 37 U/L — ABNORMAL LOW (ref 38–126)
Anion gap: 9 (ref 5–15)
BUN: 6 mg/dL (ref 6–20)
CO2: 24 mmol/L (ref 22–32)
Calcium: 8.9 mg/dL (ref 8.9–10.3)
Chloride: 100 mmol/L (ref 98–111)
Creatinine, Ser: 0.39 mg/dL — ABNORMAL LOW (ref 0.44–1.00)
GFR, Estimated: >60 mL/min (ref >60)
Glucose, Bld: 93 mg/dL (ref 70–99)
Potassium: 3.2 mmol/L — ABNORMAL LOW (ref 3.5–5.1)
Sodium: 133 mmol/L — ABNORMAL LOW (ref 135–145)
Total Bilirubin: 0.6 mg/dL (ref 0.3–1.2)
Total Protein: 7 g/dL (ref 6.5–8.1)

## 2022-03-11 LAB — CBC WITH DIFFERENTIAL/PLATELET
Abs Immature Granulocytes: 0.05 10*3/uL (ref 0.00–0.07)
Basophils Absolute: 0 10*3/uL (ref 0.0–0.1)
Basophils Relative: 0 %
Eosinophils Absolute: 0.1 10*3/uL (ref 0.0–0.5)
Eosinophils Relative: 1 %
HCT: 35 % — ABNORMAL LOW (ref 36.0–46.0)
Hemoglobin: 12.1 g/dL (ref 12.0–15.0)
Immature Granulocytes: 0 %
Lymphocytes Relative: 22 %
Lymphs Abs: 2.6 10*3/uL (ref 0.7–4.0)
MCH: 30.5 pg (ref 26.0–34.0)
MCHC: 34.6 g/dL (ref 30.0–36.0)
MCV: 88.2 fL (ref 80.0–100.0)
Monocytes Absolute: 0.8 10*3/uL (ref 0.1–1.0)
Monocytes Relative: 7 %
Neutro Abs: 8.3 10*3/uL — ABNORMAL HIGH (ref 1.7–7.7)
Neutrophils Relative %: 70 %
Platelets: 321 10*3/uL (ref 150–400)
RBC: 3.97 MIL/uL (ref 3.87–5.11)
RDW: 12.1 % (ref 11.5–15.5)
WBC: 11.9 10*3/uL — ABNORMAL HIGH (ref 4.0–10.5)
nRBC: 0 % (ref 0.0–0.2)

## 2022-03-11 MED ORDER — METOCLOPRAMIDE HCL 5 MG/ML IJ SOLN
10.0000 mg | Freq: Once | INTRAMUSCULAR | Status: AC
  Administered 2022-03-11: 10 mg via INTRAVENOUS
  Filled 2022-03-11: qty 2

## 2022-03-11 MED ORDER — ACETAMINOPHEN 500 MG PO TABS
1000.0000 mg | ORAL_TABLET | Freq: Once | ORAL | Status: AC
  Administered 2022-03-11: 1000 mg via ORAL
  Filled 2022-03-11: qty 2

## 2022-03-11 MED ORDER — SODIUM CHLORIDE 0.9 % IV BOLUS
1000.0000 mL | Freq: Once | INTRAVENOUS | Status: AC
  Administered 2022-03-11: 1000 mL via INTRAVENOUS

## 2022-03-11 MED ORDER — METOCLOPRAMIDE HCL 10 MG PO TABS
10.0000 mg | ORAL_TABLET | Freq: Once | ORAL | Status: DC
Start: 2022-03-11 — End: 2022-03-11

## 2022-03-11 NOTE — ED Triage Notes (Signed)
Pt comes into the ED via EMS from home with c/o having a syncopal episode after having multiple episode of vomiting  pt is [redacted] weeks pregnant and was here yesterday for the same. ? ? NS infusing on arrival ?4mg  zofran ?#20gLFA ?11/68 ?HR72 ?97%RA ?Temp 98.8 ?CBG97 ? ?

## 2022-03-11 NOTE — ED Provider Triage Note (Signed)
Emergency Medicine Provider Triage Evaluation Note ? ?Debra Murray , a 26 y.o. female  G3p2 currently [redacted] weeks pregnant was evaluated in triage.  Pt complains of vomiting. Unable to tolerate PO x3 days. Feels very lightheaded. No abdominal pain, no vaginal bleeding. Taking zofran and compazine and potassium at home. Recent admission. No diarrhea. First OBGYN appointment is 5/15. Reports that she feels weak. Seen yesterday. ? ?Review of Systems  ?Positive: Lightheadedness, nausea, vomiting ?Negative: Abdominal pain, dysuria, diarrhea, fever, vaginal bleeding/discharge ? ?Physical Exam  ?LMP 01/02/2022  ?Gen:   Awake, no distress   ?Resp:  Normal effort  ?MSK:   Moves extremities without difficulty  ?Other:  No abdominal tenderness ? ?Medical Decision Making  ?Medically screening exam initiated at 2:24 PM.  Appropriate orders placed.  Alesa Echevarria was informed that the remainder of the evaluation will be completed by another provider, this initial triage assessment does not replace that evaluation, and the importance of remaining in the ED until their evaluation is complete. ? ? ?  ?Jackelyn Hoehn, PA-C ?03/11/22 1429 ? ?

## 2022-03-11 NOTE — ED Notes (Signed)
?  Patient given apple juice and tolerating well. ? ?

## 2022-03-12 ENCOUNTER — Inpatient Hospital Stay: Payer: Medicaid Other

## 2022-03-12 DIAGNOSIS — Z3A09 9 weeks gestation of pregnancy: Secondary | ICD-10-CM | POA: Diagnosis not present

## 2022-03-12 DIAGNOSIS — O211 Hyperemesis gravidarum with metabolic disturbance: Secondary | ICD-10-CM | POA: Diagnosis not present

## 2022-03-12 DIAGNOSIS — O210 Mild hyperemesis gravidarum: Secondary | ICD-10-CM | POA: Diagnosis not present

## 2022-03-12 LAB — URINALYSIS, ROUTINE W REFLEX MICROSCOPIC
Bacteria, UA: NONE SEEN
Bilirubin Urine: NEGATIVE
Glucose, UA: NEGATIVE mg/dL
Hgb urine dipstick: NEGATIVE
Ketones, ur: NEGATIVE mg/dL
Leukocytes,Ua: NEGATIVE
Nitrite: NEGATIVE
Protein, ur: NEGATIVE mg/dL
Specific Gravity, Urine: 1.02 (ref 1.005–1.030)
pH: 7 (ref 5.0–8.0)

## 2022-03-12 LAB — CBC
HCT: 33.9 % — ABNORMAL LOW (ref 36.0–46.0)
Hemoglobin: 11.5 g/dL — ABNORMAL LOW (ref 12.0–15.0)
MCH: 30.3 pg (ref 26.0–34.0)
MCHC: 33.9 g/dL (ref 30.0–36.0)
MCV: 89.4 fL (ref 80.0–100.0)
Platelets: 301 10*3/uL (ref 150–400)
RBC: 3.79 MIL/uL — ABNORMAL LOW (ref 3.87–5.11)
RDW: 12.1 % (ref 11.5–15.5)
WBC: 10.6 10*3/uL — ABNORMAL HIGH (ref 4.0–10.5)
nRBC: 0 % (ref 0.0–0.2)

## 2022-03-12 LAB — HCG, QUANTITATIVE, PREGNANCY: hCG, Beta Chain, Quant, S: 116741 m[IU]/mL — ABNORMAL HIGH (ref <5)

## 2022-03-12 IMAGING — US US OB COMP LESS 14 WK
1 series · 14 of 28 positions shown · non-contrast
Comparison: [DATE].

CLINICAL DATA: Fetal viability.

EXAM:
OBSTETRIC <14 WK ULTRASOUND
TECHNIQUE: Transabdominal ultrasound was performed for evaluation of the
gestation as well as the maternal uterus and adnexal regions.

[Series 1: us ob transvaginal · 14 of 52 slices shown]
[im 2/52]
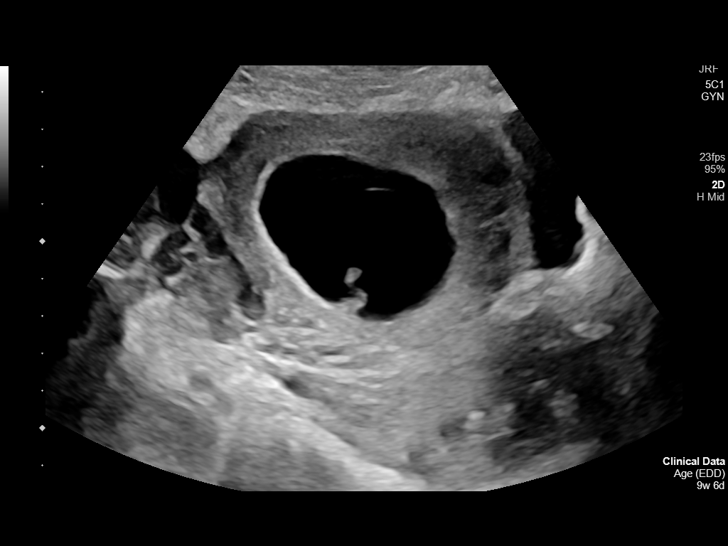
[im 6/52]
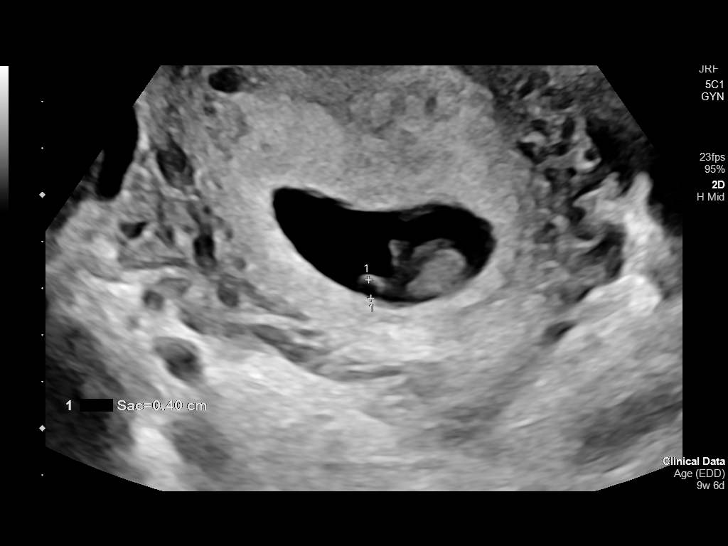
[im 10/52]
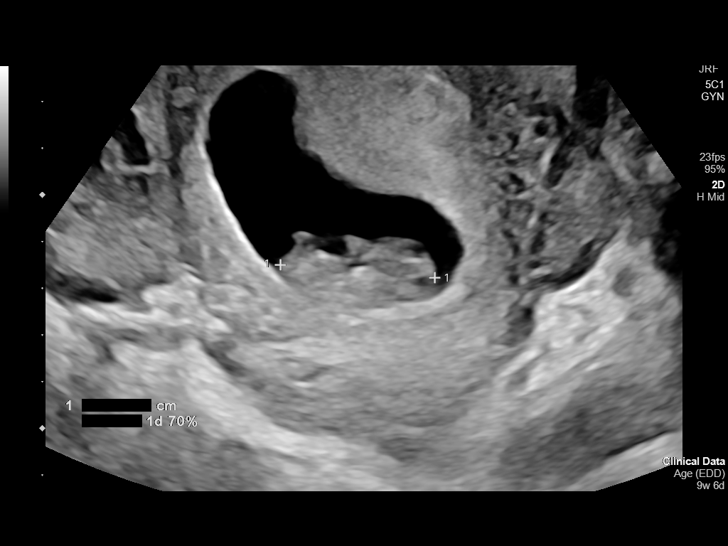
[im 14/52]
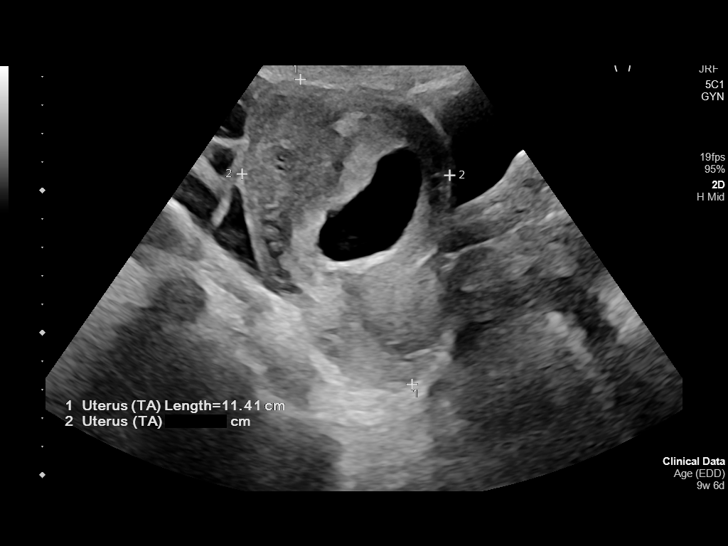
[im 18/52]
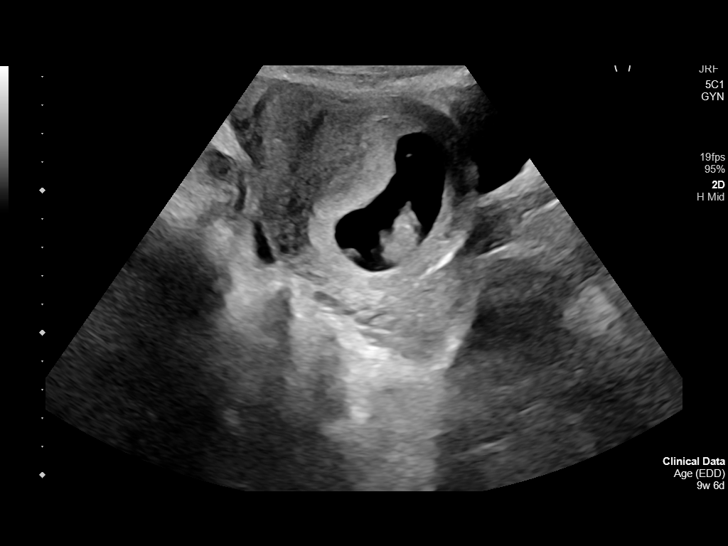
[im 21/52]
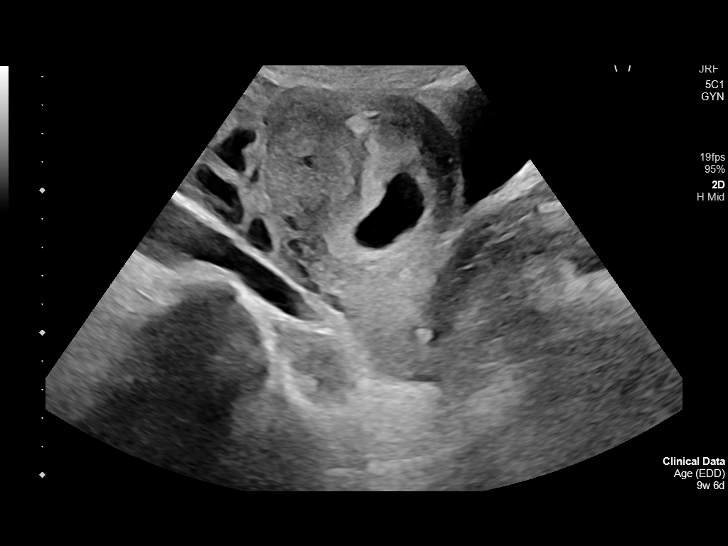
[im 25/52]
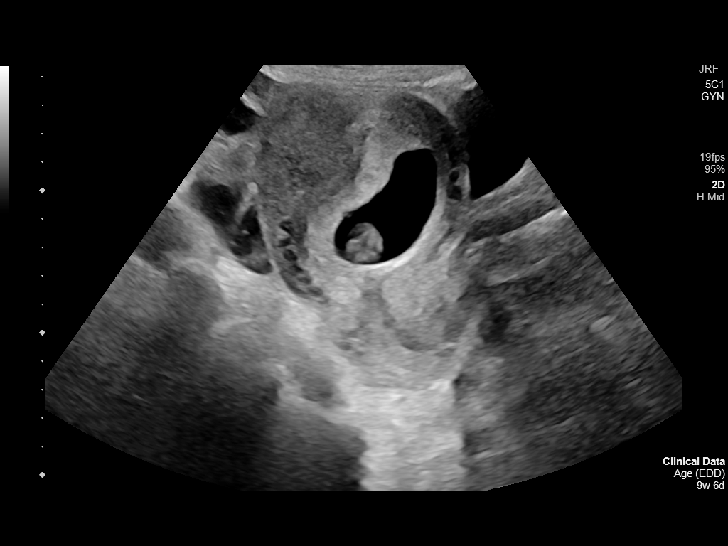
[im 29/52]
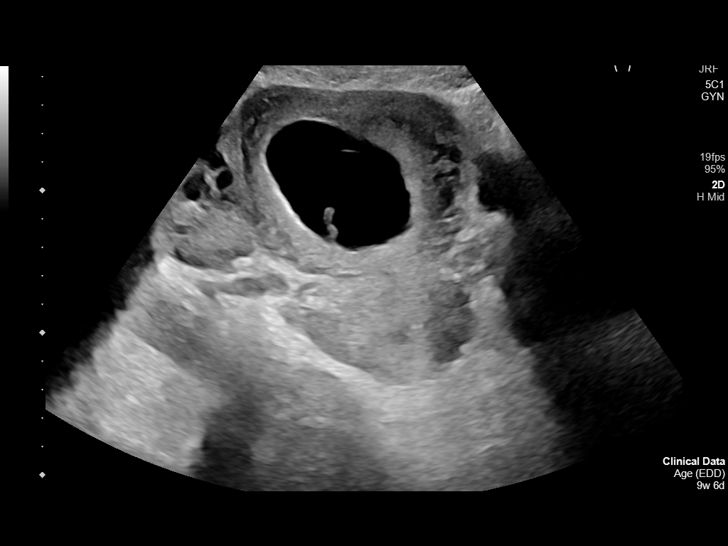
[im 33/52]
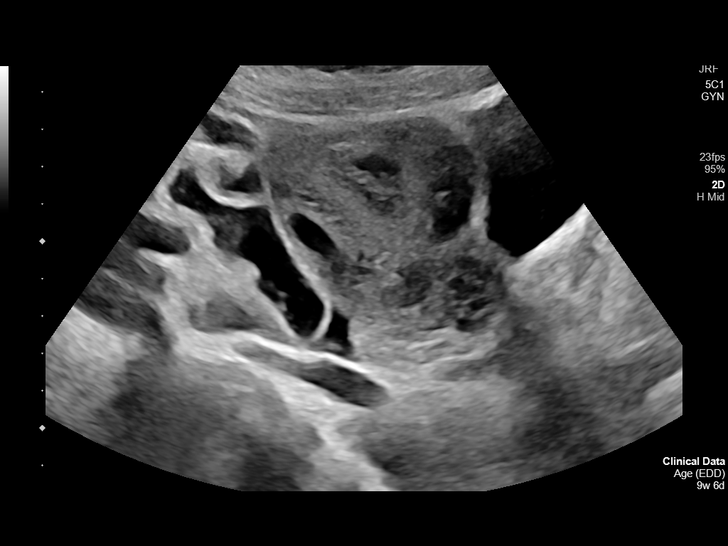
[im 36/52]
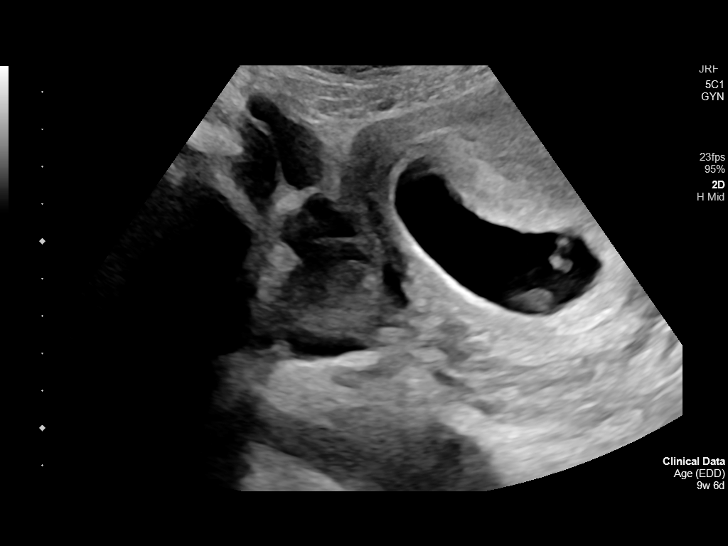
[im 40/52]
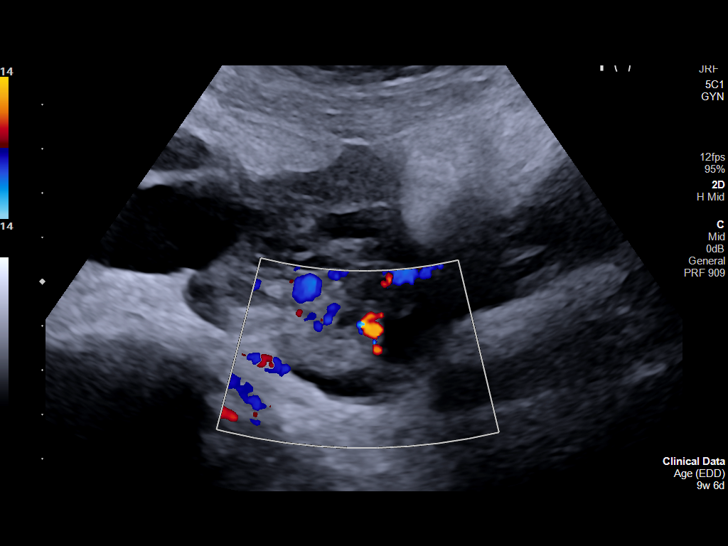
[im 44/52]
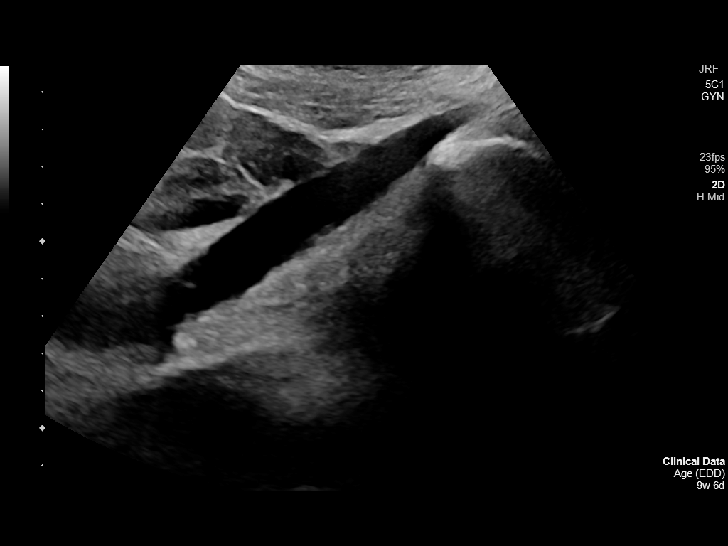
[im 48/52]
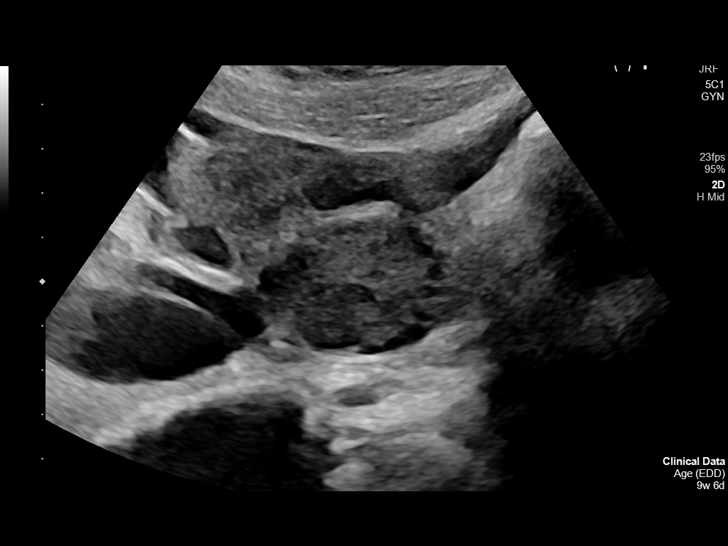
[im 52/52]
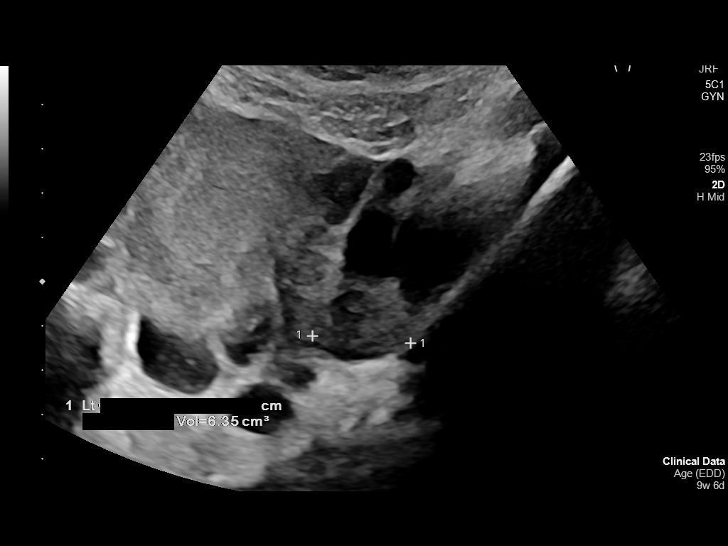

[14 of 28 positions shown; findings below may reference images not displayed]

FINDINGS: Intrauterine gestational sac: Single

Yolk sac:  Visualized.

Embryo:  Visualized.

Cardiac Activity: Visualized.

Heart Rate: 171 bpm

CRL: 33.6 mm 10 w 2 d US EDC: [DATE].

Subchorionic hemorrhage:  None visualized.

Maternal uterus/adnexae: Ovaries are unremarkable. No free fluid is
noted.
IMPRESSION: Single live intrauterine gestation of 10 weeks 2 days.

## 2022-03-12 MED ORDER — METOCLOPRAMIDE HCL 10 MG PO TABS: 10.0000 mg | ORAL_TABLET | Freq: Four times a day (QID) | ORAL | 0 refills | Status: AC

## 2022-03-12 MED ORDER — VITAMIN B-6 50 MG PO TABS
25.0000 mg | ORAL_TABLET | Freq: Three times a day (TID) | ORAL | Status: DC
  Administered 2022-03-12 – 2022-03-13 (×2): 25 mg via ORAL
  Filled 2022-03-12 (×2): qty 1

## 2022-03-12 MED ORDER — HYDROXYZINE HCL 50 MG/ML IM SOLN
50.0000 mg | Freq: Four times a day (QID) | INTRAMUSCULAR | Status: DC | PRN
  Filled 2022-03-12: qty 1

## 2022-03-12 MED ORDER — THIAMINE HCL 100 MG/ML IJ SOLN: 100.0000 mg | Freq: Once | INTRAMUSCULAR | Status: DC

## 2022-03-12 MED ORDER — ONDANSETRON HCL 4 MG/2ML IJ SOLN: 4.0000 mg | Freq: Three times a day (TID) | INTRAMUSCULAR | Status: DC | PRN

## 2022-03-12 MED ORDER — SODIUM CHLORIDE 0.9 % IV SOLN
INTRAVENOUS | Status: DC | PRN
Start: 2022-03-12 — End: 2022-03-13

## 2022-03-12 MED ORDER — METOCLOPRAMIDE HCL 5 MG/ML IJ SOLN
10.0000 mg | Freq: Four times a day (QID) | INTRAMUSCULAR | Status: DC
  Administered 2022-03-12: 10 mg via INTRAVENOUS
  Filled 2022-03-12: qty 2

## 2022-03-12 MED ORDER — ONDANSETRON 4 MG PO TBDP
4.0000 mg | ORAL_TABLET | Freq: Three times a day (TID) | ORAL | Status: DC | PRN
  Administered 2022-03-12: 8 mg via ORAL
  Filled 2022-03-12: qty 2

## 2022-03-12 MED ORDER — FOLIC ACID 5 MG/ML IJ SOLN
1.0000 mg | Freq: Once | INTRAMUSCULAR | Status: DC
  Filled 2022-03-12: qty 0.2

## 2022-03-12 MED ORDER — THIAMINE HCL 100 MG/ML IJ SOLN
Freq: Once | INTRAVENOUS | Status: AC
  Filled 2022-03-12: qty 1000

## 2022-03-12 MED ORDER — PROMETHAZINE HCL 25 MG RE SUPP: 25.0000 mg | Freq: Four times a day (QID) | RECTAL | 0 refills | Status: AC | PRN

## 2022-03-12 MED ORDER — PROMETHAZINE HCL 25 MG RE SUPP
12.5000 mg | RECTAL | Status: DC
  Filled 2022-03-12 (×7): qty 1

## 2022-03-12 MED ORDER — SODIUM CHLORIDE 0.9 % IV SOLN
8.0000 mg | Freq: Three times a day (TID) | INTRAVENOUS | Status: DC | PRN
  Filled 2022-03-12 (×2): qty 4

## 2022-03-12 MED ORDER — DOCUSATE SODIUM 100 MG PO CAPS
100.0000 mg | ORAL_CAPSULE | Freq: Two times a day (BID) | ORAL | Status: DC
  Administered 2022-03-12 – 2022-03-13 (×2): 100 mg via ORAL
  Filled 2022-03-12 (×2): qty 1

## 2022-03-12 MED ORDER — POTASSIUM CHLORIDE 10 MEQ/100ML IV SOLN
10.0000 meq | Freq: Once | INTRAVENOUS | Status: AC
  Administered 2022-03-12: 10 meq via INTRAVENOUS
  Filled 2022-03-12: qty 100

## 2022-03-12 MED ORDER — PROMETHAZINE HCL 25 MG RE SUPP
12.5000 mg | RECTAL | Status: DC | PRN
  Filled 2022-03-12: qty 1

## 2022-03-12 MED ORDER — METOCLOPRAMIDE HCL 10 MG PO TABS
10.0000 mg | ORAL_TABLET | Freq: Four times a day (QID) | ORAL | Status: DC
  Administered 2022-03-12 – 2022-03-13 (×5): 10 mg via ORAL
  Filled 2022-03-12 (×6): qty 1

## 2022-03-12 MED ORDER — PROMETHAZINE HCL 25 MG PO TABS
12.5000 mg | ORAL_TABLET | ORAL | Status: DC | PRN
  Administered 2022-03-12: 25 mg via ORAL
  Filled 2022-03-12: qty 1

## 2022-03-12 MED ORDER — LACTATED RINGERS IV SOLN
Freq: Once | INTRAVENOUS | Status: DC
Start: 2022-03-12 — End: 2022-03-12
  Filled 2022-03-12: qty 1000

## 2022-03-12 MED ORDER — LACTATED RINGERS IV SOLN: INTRAVENOUS | Status: DC

## 2022-03-12 MED ORDER — CALCIUM CARBONATE ANTACID 500 MG PO CHEW: 2.0000 | CHEWABLE_TABLET | ORAL | Status: DC | PRN

## 2022-03-12 MED ORDER — HYDROXYZINE HCL 25 MG PO TABS: 50.0000 mg | ORAL_TABLET | Freq: Four times a day (QID) | ORAL | Status: DC | PRN

## 2022-03-12 MED ORDER — PROMETHAZINE HCL 25 MG PO TABS
12.5000 mg | ORAL_TABLET | ORAL | Status: DC
  Administered 2022-03-12 – 2022-03-13 (×6): 25 mg via ORAL
  Filled 2022-03-12 (×6): qty 1

## 2022-03-12 MED ORDER — PROMETHAZINE HCL 25 MG PO TABS: 25.0000 mg | ORAL_TABLET | ORAL | 0 refills | Status: AC

## 2022-03-12 MED ORDER — ACETAMINOPHEN 325 MG PO TABS
650.0000 mg | ORAL_TABLET | ORAL | Status: DC | PRN
  Administered 2022-03-12 – 2022-03-13 (×2): 650 mg via ORAL
  Filled 2022-03-12 (×2): qty 2

## 2022-03-12 NOTE — ED Provider Notes (Addendum)
? ?[facility] ?Provider Note ? ? ? Event Date/Time  ? First MD Initiated Contact with Patient 03/11/22 2147   ?  (approximate) ? ? ?History  ? ?Hyperemesis Gravidarum ? ? ?HPI ? ?Debra Murray is a 26 y.o. female G3 P2 approximately [redacted] weeks pregnant who comes ED complaining of nausea and vomiting for the past 3 days, unable to eat or drink.  Unable to tolerate taking her oral Compazine at home that normally controls her nausea and vomiting symptoms.  Denies any dysuria, abdominal pain, vaginal bleeding or discharge, fluid leakage, pelvic cramping pain.  No shortness of breath or chest pain, no fever. ? ? ?  ? ? ?Physical Exam  ? ?Triage Vital Signs: ?ED Triage Vitals  ?Enc Vitals Group  ?   BP 03/11/22 1425 117/67  ?   Pulse Rate 03/11/22 1425 92  ?   Resp 03/11/22 1425 19  ?   Temp 03/11/22 1425 98.5 ?F (36.9 ?C)  ?   Temp Source 03/11/22 1425 Oral  ?   SpO2 03/11/22 1425 100 %  ?   Weight 03/11/22 1425 170 lb (77.1 kg)  ?   Height 03/11/22 1425 6\' 1"  (1.854 m)  ?   Head Circumference --   ?   Peak Flow --   ?   Pain Score 03/11/22 2202 0  ?   Pain Loc --   ?   Pain Edu? --   ?   Excl. in GC? --   ? ? ?Most recent vital signs: ?Vitals:  ? 03/11/22 2109 03/11/22 2203  ?BP: 102/65 113/61  ?Pulse: 82 81  ?Resp: 16 18  ?Temp:    ?SpO2: 98% 100%  ? ? ? ?General: Awake, no distress.  ?CV:  Good peripheral perfusion.  ?Resp:  Normal effort.  ?Abd:  No distention.  Soft and nontender ?Other:  No rash, no lower extremity edema. ? ? ?ED Results / Procedures / Treatments  ? ?Labs ?(all labs ordered are listed, but only abnormal results are displayed) ?Labs Reviewed  ?CBC WITH DIFFERENTIAL/PLATELET - Abnormal; Notable for the following components:  ?    Result Value  ? WBC 11.9 (*)   ? HCT 35.0 (*)   ? Neutro Abs 8.3 (*)   ? All other components within normal limits  ?COMPREHENSIVE METABOLIC PANEL - Abnormal; Notable for the following components:  ? Sodium 133 (*)   ? Potassium 3.2 (*)   ? Creatinine,  Ser 0.39 (*)   ? Alkaline Phosphatase 37 (*)   ? All other components within normal limits  ?URINALYSIS, ROUTINE W REFLEX MICROSCOPIC  ?HCG, QUANTITATIVE, PREGNANCY  ? ? ? ?EKG ? ? ? ? ?RADIOLOGY ? ? ? ? ?PROCEDURES: ? ?Critical Care performed: No ? ?Procedures ? ? ?MEDICATIONS ORDERED IN ED: ?Medications  ?sodium chloride 0.9 % bolus 1,000 mL (0 mLs Intravenous Stopped 03/11/22 2202)  ?acetaminophen (TYLENOL) tablet 1,000 mg (1,000 mg Oral Given 03/11/22 2212)  ?metoCLOPramide (REGLAN) injection 10 mg (10 mg Intravenous Given 03/11/22 2212)  ? ? ? ?IMPRESSION / MDM / ASSESSMENT AND PLAN / ED COURSE  ?I reviewed the triage vital signs and the nursing notes. ?             ?               ? ?Differential diagnosis includes, but is not limited to, dehydration, electrolyte abnormality.  Doubt appendicitis, ovarian cyst, ovarian torsion, STI, PID, cholecystitis, pancreatitis ? ?Patient presents with  generalized abdominal discomfort, nausea vomiting and p.o. intolerance.  Vital signs unremarkable, labs are reassuring other than mild hypokalemia. ? ?After IV fluids and IV Reglan, patient reports that her symptoms are not improved.  She was seen here for similar symptoms yesterday, and reports symptoms immediately reoccurred after discharge from the ED yesterday.  She request hospitalization which I think is reasonable.  I discussed with midwife Jodel Mayhall who accepts to mother-baby floor for supportive care. ? ?I reviewed outside records from [facility] where patient had pelvic ultrasound on February 19, 2022 which showed viable IUP. ? ? ?  ? ? ?FINAL CLINICAL IMPRESSION(S) / ED DIAGNOSES  ? ?Final diagnoses:  ?Hyperemesis gravidarum before end of [redacted] week gestation, dehydration  ? ? ? ?Rx / DC Orders  ? ?ED Discharge Orders   ? ? None  ? ?  ? ? ? ?Note:  This document was prepared using Dragon voice recognition software and may include unintentional dictation errors. ?  ?Sharman Cheek, MD ?03/12/22 0007 ? ?  ?Sharman Cheek,  MD ?03/12/22 0007 ? ?

## 2022-03-12 NOTE — Progress Notes (Signed)
Pt admitted to MB unit. RN entered room and pt asked about eating. RN explained that she would have to speak with CNM since she was NPO.  ? ?CNM Emalee Knies, came to beside. Explained to pt that with hyperemesis we expect pts to be NPO. Pt still desired something to eat or drink. CNM allowed pt to try some apple juice and a sandwich tray.  ? ?RN encouraged pt to take it slow and to stop eating if she became nauseated.  ? ?Pt states that she had no nausea with the tray and that she "feels much better." She felt that her "stomach isn't turning up anymore."   ? ?Spoke with Duwayne Heck, will plan to advance pt to regular diet if nausea doesn't persist.  ?

## 2022-03-12 NOTE — H&P (Signed)
HISTORY AND PHYSICAL NOTE ? ?History of Present Illness: ?Debra Murray is a 26 y.o. G3P2002 at [redacted]w[redacted]d admitted for intractable nausea and vomiting in pregnancy.  She presented to the ER with complaints of nausea, vomiting, weakness, and passing out at home.  She has been unable to successfully keep food or liquids down for 24hrs.  She has tried Compazine to help with N/V but has not been effective.  She denies cramping, vaginal bleeding, or LOF.  Has not yet been able to feel fetal movement yet.   ? ?This is her 8th hospital visit this pregnancy for hyperemesis gravidarum. ? ?Factors complicating pregnancy:  ?Hyperemesis gravidarum ?Asthma  ? ?Prenatal care site:  ?[facility] ? ?Patient Active Problem List  ? Diagnosis Date Noted  ? Nausea and vomiting 03/06/2022  ? Hyperemesis gravidarum 02/25/2022  ? GI bleeding 02/25/2022  ? Hypokalemia 02/25/2022  ? ? ?Past Medical History:  ?Diagnosis Date  ? Asthma   ? ? ?Past Surgical History:  ?Procedure Laterality Date  ? TONSILLECTOMY    ? as a child  ? ? ?OB History  ?Gravida Para Term Preterm AB Living  ?3 2 2     2   ?SAB IAB Ectopic Multiple Live Births  ?        2  ?  ?# Outcome Date GA Lbr Len/2nd Weight Sex Delivery Anes PTL Lv  ?3 Current           ?2 Term 09/19/19 [redacted]w[redacted]d  3317 g M Vag-Spont EPI N LIV  ?1 Term 11/16/12 [redacted]w[redacted]d  3345 g F Vag-Spont EPI N LIV  ? ? ?Social History:  reports that she has never smoked. She has never used smokeless tobacco. She reports that she does not currently use alcohol. She reports that she does not currently use drugs after having used the following drugs: Marijuana. ? ?Family History: family history includes Hypertension in her father and mother. ? ?No Known Allergies ? ?Medications Prior to Admission  ?Medication Sig Dispense Refill Last Dose  ? doxylamine, Sleep, (UNISOM) 25 MG tablet Take 1 tablet (25 mg total) by mouth 2 (two) times daily. 60 tablet 0 03/11/2022  ? ondansetron (ZOFRAN) 8 MG tablet Take 1 tablet (8 mg total) by mouth every  8 (eight) hours as needed for nausea or vomiting. 20 tablet 0 prn at prn  ? ondansetron (ZOFRAN-ODT) 4 MG disintegrating tablet Take 1 tablet (4 mg total) by mouth every 6 (six) hours as needed for nausea or vomiting. 20 tablet 0 prn at prn  ? Prenatal Vit-Fe Fumarate-FA (PRENATAL VITAMIN) 27-0.8 MG TABS Take 1 tablet by mouth daily. 30 tablet 1 03/11/2022  ? prochlorperazine (COMPAZINE) 10 MG tablet Take 1 tablet (10 mg total) by mouth every 6 (six) hours as needed for nausea or vomiting. 30 tablet 0 prn at prn  ? promethazine (PHENERGAN) 12.5 MG suppository SMARTSIG:1 SUPPOS Rectally Every 4 Hours PRN   prn at prn  ? pyridOXINE (VITAMIN B-6) 25 MG tablet Take 1 tablet (25 mg total) by mouth daily. 30 tablet 0 03/11/2022  ? ? ?ROS ? ?Physical Examination: ?Vitals:  BP 107/73 (BP Location: Left Arm)   Pulse 72   Temp 98.9 ?F (37.2 ?C) (Oral)   Resp 16   Ht 6\' 1"  (1.854 m)   Wt 77.1 kg   LMP 01/02/2022   SpO2 100%   BMI 22.43 kg/m?  ?General: no acute distress.  ?HEENT: normocephalic, atraumatic ?Heart: regular rate & rhythm.  No murmurs/rubs/gallops ?Lungs: clear to auscultation  bilaterally, normal respiratory effort ?Abdomen: soft, gravid, c/w with [redacted] week gestation, non-tender  ?Pelvic: deferred  ?Extremities: non-tender, symmetric, no edema bilaterally.  DTRs: +2  ?Neurologic: Alert & oriented x 3.   ? ?TVUS: ordered ? ?Labs:  ?Results for orders placed or performed during the hospital encounter of 03/11/22 (from the past 24 hour(s))  ?CBC with Differential  ? Collection Time: 03/11/22  2:27 PM  ?Result Value Ref Range  ? WBC 11.9 (H) 4.0 - 10.5 K/uL  ? RBC 3.97 3.87 - 5.11 MIL/uL  ? Hemoglobin 12.1 12.0 - 15.0 g/dL  ? HCT 35.0 (L) 36.0 - 46.0 %  ? MCV 88.2 80.0 - 100.0 fL  ? MCH 30.5 26.0 - 34.0 pg  ? MCHC 34.6 30.0 - 36.0 g/dL  ? RDW 12.1 11.5 - 15.5 %  ? Platelets 321 150 - 400 K/uL  ? nRBC 0.0 0.0 - 0.2 %  ? Neutrophils Relative % 70 %  ? Neutro Abs 8.3 (H) 1.7 - 7.7 K/uL  ? Lymphocytes Relative 22 %  ?  Lymphs Abs 2.6 0.7 - 4.0 K/uL  ? Monocytes Relative 7 %  ? Monocytes Absolute 0.8 0.1 - 1.0 K/uL  ? Eosinophils Relative 1 %  ? Eosinophils Absolute 0.1 0.0 - 0.5 K/uL  ? Basophils Relative 0 %  ? Basophils Absolute 0.0 0.0 - 0.1 K/uL  ? Immature Granulocytes 0 %  ? Abs Immature Granulocytes 0.05 0.00 - 0.07 K/uL  ?Comprehensive metabolic panel  ? Collection Time: 03/11/22  2:27 PM  ?Result Value Ref Range  ? Sodium 133 (L) 135 - 145 mmol/L  ? Potassium 3.2 (L) 3.5 - 5.1 mmol/L  ? Chloride 100 98 - 111 mmol/L  ? CO2 24 22 - 32 mmol/L  ? Glucose, Bld 93 70 - 99 mg/dL  ? BUN 6 6 - 20 mg/dL  ? Creatinine, Ser 0.39 (L) 0.44 - 1.00 mg/dL  ? Calcium 8.9 8.9 - 10.3 mg/dL  ? Total Protein 7.0 6.5 - 8.1 g/dL  ? Albumin 4.1 3.5 - 5.0 g/dL  ? AST 20 15 - 41 U/L  ? ALT 32 0 - 44 U/L  ? Alkaline Phosphatase 37 (L) 38 - 126 U/L  ? Total Bilirubin 0.6 0.3 - 1.2 mg/dL  ? GFR, Estimated >60 >60 mL/min  ? Anion gap 9 5 - 15  ?hCG, quantitative, pregnancy  ? Collection Time: 03/11/22  2:27 PM  ?Result Value Ref Range  ? hCG, Beta Chain, Quant, S 116,741 (H) <5 mIU/mL  ? ? ?Imaging Studies: ?MR PELVIS WO CONTRAST ? ?Result Date: 02/27/2022 ?CLINICAL DATA:  Pregnant, right lower quadrant abdominal pain EXAM: MRI ABDOMEN AND PELVIS WITHOUT CONTRAST TECHNIQUE: Multiplanar multisequence MR imaging of the abdomen and pelvis was performed. No intravenous contrast was administered. COMPARISON:  OB ultrasound dated 02/27/2022 FINDINGS: Lower chest: Lung bases are clear. Hepatobiliary: Liver is within normal limits. Gallbladder is unremarkable. No intrahepatic or extrahepatic ductal dilatation. Pancreas:  Within normal limits. Spleen:  Within normal limits. Adrenals/Urinary Tract:  Adrenal glands are within normal limits. Kidneys are within normal limits.  No hydronephrosis. Bladder is within normal limits. Stomach/Bowel: Stomach is within normal limits. No evidence of bowel obstruction. Normal appendix (series 9/image 71). No colonic wall  thickening or inflammatory changes. Vascular/Lymphatic:  No evidence of abdominal aortic aneurysm. No suspicious abdominopelvic lymphadenopathy. Reproductive: Early gravid uterus, better evaluated on recent OB ultrasound. Left ovary is within normal limits. 2.7 cm right ovarian corpus luteum (series 9/image 86), physiologic/benign.  No follow-up recommended. Other:  Small volume pelvic ascites, likely physiologic. Musculoskeletal: No focal osseous lesions. IMPRESSION: Normal appendix. Early gravid uterus, better evaluated on recent OB ultrasound. 2.7 cm right ovarian corpus luteum, physiologic/benign. No follow-up recommended. Electronically Signed   By: Charline Bills M.D.   On: 02/27/2022 19:09  ? ?MR ABDOMEN WO CONTRAST ? ?Result Date: 02/27/2022 ?CLINICAL DATA:  Pregnant, right lower quadrant abdominal pain EXAM: MRI ABDOMEN AND PELVIS WITHOUT CONTRAST TECHNIQUE: Multiplanar multisequence MR imaging of the abdomen and pelvis was performed. No intravenous contrast was administered. COMPARISON:  OB ultrasound dated 02/27/2022 FINDINGS: Lower chest: Lung bases are clear. Hepatobiliary: Liver is within normal limits. Gallbladder is unremarkable. No intrahepatic or extrahepatic ductal dilatation. Pancreas:  Within normal limits. Spleen:  Within normal limits. Adrenals/Urinary Tract:  Adrenal glands are within normal limits. Kidneys are within normal limits.  No hydronephrosis. Bladder is within normal limits. Stomach/Bowel: Stomach is within normal limits. No evidence of bowel obstruction. Normal appendix (series 9/image 71). No colonic wall thickening or inflammatory changes. Vascular/Lymphatic:  No evidence of abdominal aortic aneurysm. No suspicious abdominopelvic lymphadenopathy. Reproductive: Early gravid uterus, better evaluated on recent OB ultrasound. Left ovary is within normal limits. 2.7 cm right ovarian corpus luteum (series 9/image 86), physiologic/benign. No follow-up recommended. Other:  Small volume  pelvic ascites, likely physiologic. Musculoskeletal: No focal osseous lesions. IMPRESSION: Normal appendix. Early gravid uterus, better evaluated on recent OB ultrasound. 2.7 cm right ovarian corpus luteum, physiolo

## 2022-03-12 NOTE — Progress Notes (Signed)
ANTEPARTUM PROGRESS NOTE ? ?Debra Murray is a 26 y.o. G3P2002 at [redacted]w[redacted]d who is admitted for Hyperemesis gravidarum ? ?Estimated Date of Delivery: 10/10/22 ? ?Length of Stay:  0 Days. Admitted 03/11/2022 ? ?Subjective: nauseated, continues to have excess saliva. Has been able to tolerate PO meds, feels that phenergan has helped the most so far.  ?She reports: She states she was able to keep down her regular breakfast and lunch without vomiting.  ?-no leakage of fluid ?-no vaginal bleeding ? ? ?Vitals:  BP (!) 106/56 (BP Location: Right Arm)   Pulse 82   Temp 98.7 ?F (37.1 ?C)   Resp 20   Ht 6\' 1"  (1.854 m)   Wt 77.1 kg   LMP 01/02/2022   SpO2 100%   BMI 22.43 kg/m?  ?Physical Examination: ?General:   alert, cooperative, and appears stated age  ?Skin:  normal  ?Neurologic:    Alert & oriented x 3  ?Lungs:   clear to auscultation bilaterally  ?Heart:   regular rate and rhythm, S1, S2 normal, no murmur, click, rub or gallop  ?Abdomen:  soft, non-tender; bowel sounds normal; no masses,  no organomegaly  ?Pelvis:  Exam deferred.  ?Extremities: : non-tender, symmetric, no edema bilaterally.     ? ?Fetal monitoring: FHR via [place] today ? ?S/p [place] today with viable SIUP at [redacted]w[redacted]d ? ? ?Results for orders placed or performed during the hospital encounter of 03/11/22 (from the past 48 hour(s))  ?CBC with Differential     Status: Abnormal  ? Collection Time: 03/11/22  2:27 PM  ?Result Value Ref Range  ? WBC 11.9 (H) 4.0 - 10.5 K/uL  ? RBC 3.97 3.87 - 5.11 MIL/uL  ? Hemoglobin 12.1 12.0 - 15.0 g/dL  ? HCT 35.0 (L) 36.0 - 46.0 %  ? MCV 88.2 80.0 - 100.0 fL  ? MCH 30.5 26.0 - 34.0 pg  ? MCHC 34.6 30.0 - 36.0 g/dL  ? RDW 12.1 11.5 - 15.5 %  ? Platelets 321 150 - 400 K/uL  ? nRBC 0.0 0.0 - 0.2 %  ? Neutrophils Relative % 70 %  ? Neutro Abs 8.3 (H) 1.7 - 7.7 K/uL  ? Lymphocytes Relative 22 %  ? Lymphs Abs 2.6 0.7 - 4.0 K/uL  ? Monocytes Relative 7 %  ? Monocytes Absolute 0.8 0.1 - 1.0 K/uL  ? Eosinophils Relative 1 %  ? Eosinophils  Absolute 0.1 0.0 - 0.5 K/uL  ? Basophils Relative 0 %  ? Basophils Absolute 0.0 0.0 - 0.1 K/uL  ? Immature Granulocytes 0 %  ? Abs Immature Granulocytes 0.05 0.00 - 0.07 K/uL  ?  Comment: Performed at [facility], [address] [place], [place] [address]  ?Comprehensive metabolic panel     Status: Abnormal  ? Collection Time: 03/11/22  2:27 PM  ?Result Value Ref Range  ? Sodium 133 (L) 135 - 145 mmol/L  ? Potassium 3.2 (L) 3.5 - 5.1 mmol/L  ? Chloride 100 98 - 111 mmol/L  ? CO2 24 22 - 32 mmol/L  ? Glucose, Bld 93 70 - 99 mg/dL  ?  Comment: Glucose reference range applies only to samples taken after fasting for at least 8 hours.  ? BUN 6 6 - 20 mg/dL  ? Creatinine, Ser 0.39 (L) 0.44 - 1.00 mg/dL  ? Calcium 8.9 8.9 - 10.3 mg/dL  ? Total Protein 7.0 6.5 - 8.1 g/dL  ? Albumin 4.1 3.5 - 5.0 g/dL  ? AST 20 15 -  41 U/L  ? ALT 32 0 - 44 U/L  ? Alkaline Phosphatase 37 (L) 38 - 126 U/L  ? Total Bilirubin 0.6 0.3 - 1.2 mg/dL  ? GFR, Estimated >60 >60 mL/min  ?  Comment: (NOTE) ?Calculated using the CKD-EPI Creatinine Equation (2021) ?  ? Anion gap 9 5 - 15  ?  Comment: Performed at [facility], [address] [place], [place] [address]  ?hCG, quantitative, pregnancy     Status: Abnormal  ? Collection Time: 03/11/22  2:27 PM  ?Result Value Ref Range  ? hCG, Beta Chain, Quant, S 116,741 (H) <5 mIU/mL  ?  Comment:        ?  GEST. AGE      CONC.  (mIU/mL) ?  <=1 WEEK        5 - 50 ?    2 WEEKS       50 - 500 ?    3 WEEKS       100 - 10,000 ?    4 WEEKS     1,000 - 30,000 ?    5 WEEKS     3,500 - 115,000 ?  6-8 WEEKS     12,000 - 270,000 ?   12 WEEKS     15,000 - 220,000 ?       ?FEMALE AND NON-PREGNANT FEMALE: ?    LESS THAN 5 mIU/mL ?Performed at [facility], [address], [place], [place] [address] ?  ?CBC     Status: Abnormal  ? Collection Time: 03/12/22  2:11 AM  ?Result Value Ref Range  ? WBC 10.6 (H) 4.0 - 10.5 K/uL  ? RBC 3.79 (L) 3.87 - 5.11 MIL/uL  ? Hemoglobin 11.5 (L) 12.0 - 15.0  g/dL  ? HCT 33.9 (L) 36.0 - 46.0 %  ? MCV 89.4 80.0 - 100.0 fL  ? MCH 30.3 26.0 - 34.0 pg  ? MCHC 33.9 30.0 - 36.0 g/dL  ? RDW 12.1 11.5 - 15.5 %  ? Platelets 301 150 - 400 K/uL  ? nRBC 0.0 0.0 - 0.2 %  ?  Comment: Performed at [facility], [address] [place], [place] [address]  ?Urinalysis, Routine w reflex microscopic Urine, Clean Catch     Status: Abnormal  ? Collection Time: 03/12/22  2:15 AM  ?Result Value Ref Range  ? Color, Urine YELLOW (A) YELLOW  ? APPearance TURBID (A) CLEAR  ? Specific Gravity, Urine 1.020 1.005 - 1.030  ? pH 7.0 5.0 - 8.0  ? Glucose, UA NEGATIVE NEGATIVE mg/dL  ? Hgb urine dipstick NEGATIVE NEGATIVE  ? Bilirubin Urine NEGATIVE NEGATIVE  ? Ketones, ur NEGATIVE NEGATIVE mg/dL  ? Protein, ur NEGATIVE NEGATIVE mg/dL  ? Nitrite NEGATIVE NEGATIVE  ? Leukocytes,Ua NEGATIVE NEGATIVE  ? RBC / HPF 6-10 0 - 5 RBC/hpf  ? WBC, UA 0-5 0 - 5 WBC/hpf  ? Bacteria, UA NONE SEEN NONE SEEN  ? Squamous Epithelial / LPF 0-5 0 - 5  ? Mucus PRESENT   ? Amorphous Crystal PRESENT   ?  Comment: Performed at [facility], [address], [place], [place] [address]  ? ? ?US OB Comp Less 14 Wks ? ?Result Date: 03/12/2022 ?CLINICAL DATA:  Fetal viability. EXAM: OBSTETRIC <14 WK ULTRASOUND TECHNIQUE: Transabdominal ultrasound was performed for evaluation of the gestation as well as the maternal uterus and adnexal regions. COMPARISON:  February 27, 2022. FINDINGS: Intrauterine gestational sac: Single Yolk sac:  Visualized. Embryo:  Visualized. Cardiac Activity: Visualized. Heart Rate: 171 bpm CRL: 33.6 mm 10 w 2 d [place] [facility]: October 06, 2022. Subchorionic hemorrhage:  None visualized. Maternal uterus/adnexae: Ovaries are unremarkable. No free fluid is noted. IMPRESSION: Single live intrauterine gestation of 10 weeks 2 days. Electronically Signed   By: Marijo Conception M.D.   On: 03/12/2022 08:50   ? ?Current scheduled medications ? docusate sodium  100 mg Oral BID  ? metoCLOPramide  10 mg Oral Q6H  ?  Or  ? metoCLOPramide (REGLAN) injection  10 mg Intravenous Q6H  ? promethazine  12.5-25 mg Oral Q4H  ? Or  ? promethazine  12.5-25 mg Rectal Q4H  ? vitamin B-6  25 mg Oral TID  ? ? ?I have reviewed the patient's current medications. ? ?ASSESSMENT: ?Patient Active Problem List  ? Diagnosis Date Noted  ? Nausea and vomiting 03/06/2022  ? Hyperemesis gravidarum 02/25/2022  ? GI bleeding 02/25/2022  ? Hypokalemia 02/25/2022  ? ? ?PLAN: ?Transition to saline lock now.  ?PO meds: scheduled phenergan and reglan, discussed importance of scheduled meds even if feeling better at the time. May use zofran, phenergan suppository or compazine for breakthrough.  ?Reviewed importance of slow steady sipping of fluids, small frequent meals, continue to hydrate with electrolyte drinks and water.  ?Stool softeners added, B6 ordered TID.  ?Tentatively plan for DC in AM.  ?Rx sent to pharmacy for phenergan tablets, suppositories, PO reglan and Vitamin B6.  ? ? ? ?Francetta Found, CNM  ?Certified Nurse Midwife ?[facility] ?[facility]  ?

## 2022-03-12 NOTE — TOC Initial Note (Signed)
Transition of Care (TOC) - Initial/Assessment Note  ? ? ?Patient Details  ?Name: Debra Murray ?MRN: [id number] ?Date of Birth: [birth year] ? ?Transition of Care (TOC) CM/SW Contact:    ?Guayanilla Cellar, RN ?Phone Number: ?03/12/2022, 3:32 PM ? ?Clinical Narrative:                 ?Spoke with patient regarding dc planning needs. Patient reports she has a 59 and 28 year old at home along with significant other who is very supportive and assists as needed. Engaged with [facility] services and no concerns related to housing, food or transportation. Patient works as a Engineer, site. Patient reports she has first OB appointment scheduled. No current TOC needs or concerns.  ? ?  ?  ? ? ?Patient Goals and CMS Choice ?  ?  ?  ? ?Expected Discharge Plan and Services ?  ?  ?  ?  ?  ?                ?  ?  ?  ?  ?  ?  ?  ?  ?  ?  ? ?Prior Living Arrangements/Services ?  ?  ?  ?       ?  ?  ?  ?  ? ?Activities of Daily Living ?Home Assistive Devices/Equipment: None ?ADL Screening (condition at time of admission) ?Patient's cognitive ability adequate to safely complete daily activities?: Yes ?Is the patient deaf or have difficulty hearing?: No ?Does the patient have difficulty seeing, even when wearing glasses/contacts?: No ?Does the patient have difficulty concentrating, remembering, or making decisions?: No ?Patient able to express need for assistance with ADLs?: Yes ?Does the patient have difficulty dressing or bathing?: No ?Independently performs ADLs?: Yes (appropriate for developmental age) ?Does the patient have difficulty walking or climbing stairs?: No ?Weakness of Legs: None ?Weakness of Arms/Hands: None ? ?Permission Sought/Granted ?  ?  ?   ?   ?   ?   ? ?Emotional Assessment ?  ?  ?  ?  ?  ?  ? ?Admission diagnosis:  Hyperemesis gravidarum [O21.0] ?Hyperemesis gravidarum before end of [redacted] week gestation, dehydration [O21.1] ?Patient Active Problem List  ? Diagnosis Date Noted  ? Nausea and vomiting 03/06/2022  ? Hyperemesis  gravidarum 02/25/2022  ? GI bleeding 02/25/2022  ? Hypokalemia 02/25/2022  ? ?PCP:  Pcp, No ?Pharmacy:   ?[id number] - [place], [place] - [address] [place] [address] ?[address] [place] [address] ?[place] [address] ?Phone: [phone] Fax: [phone] ? ? ? ? ?Social Determinants of Health (SDOH) Interventions ?  ? ?Readmission Risk Interventions ?   ? View : No data to display.  ?  ?  ?  ? ? ? ?

## 2022-03-13 LAB — BASIC METABOLIC PANEL
Anion gap: 5 (ref 5–15)
BUN: 5 mg/dL — ABNORMAL LOW (ref 6–20)
CO2: 24 mmol/L (ref 22–32)
Calcium: 8.5 mg/dL — ABNORMAL LOW (ref 8.9–10.3)
Chloride: 104 mmol/L (ref 98–111)
Creatinine, Ser: 0.47 mg/dL (ref 0.44–1.00)
GFR, Estimated: >60 mL/min (ref >60)
Glucose, Bld: 87 mg/dL (ref 70–99)
Potassium: 3.8 mmol/L (ref 3.5–5.1)
Sodium: 133 mmol/L — ABNORMAL LOW (ref 135–145)

## 2022-03-13 NOTE — Discharge Summary (Signed)
Patient ID: ?Debra Murray ?MRN: [id number] ?DOB/AGE: June 25, 1996 26 y.o. ? ?Admit date: 03/11/2022 ?Discharge date: 03/13/2022 ? ?Admission Diagnoses: Hyperemesis gravidarum [O21.0] ?Hyperemesis gravidarum before end of [redacted] week gestation, dehydration [O21.1]  ? ?Discharge Diagnoses: same ? ?Prenatal Care Site:  [facility] ? ?Prenatal Procedures: ultrasound ? ?Significant Diagnostic Studies:  ?Results for orders placed or performed during the hospital encounter of 03/11/22 (from the past 168 hour(s))  ?CBC with Differential  ? Collection Time: 03/11/22  2:27 PM  ?Result Value Ref Range  ? WBC 11.9 (H) 4.0 - 10.5 K/uL  ? RBC 3.97 3.87 - 5.11 MIL/uL  ? Hemoglobin 12.1 12.0 - 15.0 g/dL  ? HCT 35.0 (L) 36.0 - 46.0 %  ? MCV 88.2 80.0 - 100.0 fL  ? MCH 30.5 26.0 - 34.0 pg  ? MCHC 34.6 30.0 - 36.0 g/dL  ? RDW 12.1 11.5 - 15.5 %  ? Platelets 321 150 - 400 K/uL  ? nRBC 0.0 0.0 - 0.2 %  ? Neutrophils Relative % 70 %  ? Neutro Abs 8.3 (H) 1.7 - 7.7 K/uL  ? Lymphocytes Relative 22 %  ? Lymphs Abs 2.6 0.7 - 4.0 K/uL  ? Monocytes Relative 7 %  ? Monocytes Absolute 0.8 0.1 - 1.0 K/uL  ? Eosinophils Relative 1 %  ? Eosinophils Absolute 0.1 0.0 - 0.5 K/uL  ? Basophils Relative 0 %  ? Basophils Absolute 0.0 0.0 - 0.1 K/uL  ? Immature Granulocytes 0 %  ? Abs Immature Granulocytes 0.05 0.00 - 0.07 K/uL  ?Comprehensive metabolic panel  ? Collection Time: 03/11/22  2:27 PM  ?Result Value Ref Range  ? Sodium 133 (L) 135 - 145 mmol/L  ? Potassium 3.2 (L) 3.5 - 5.1 mmol/L  ? Chloride 100 98 - 111 mmol/L  ? CO2 24 22 - 32 mmol/L  ? Glucose, Bld 93 70 - 99 mg/dL  ? BUN 6 6 - 20 mg/dL  ? Creatinine, Ser 0.39 (L) 0.44 - 1.00 mg/dL  ? Calcium 8.9 8.9 - 10.3 mg/dL  ? Total Protein 7.0 6.5 - 8.1 g/dL  ? Albumin 4.1 3.5 - 5.0 g/dL  ? AST 20 15 - 41 U/L  ? ALT 32 0 - 44 U/L  ? Alkaline Phosphatase 37 (L) 38 - 126 U/L  ? Total Bilirubin 0.6 0.3 - 1.2 mg/dL  ? GFR, Estimated >60 >60 mL/min  ? Anion gap 9 5 - 15  ?hCG, quantitative, pregnancy  ? Collection  Time: 03/11/22  2:27 PM  ?Result Value Ref Range  ? hCG, Beta Chain, Quant, S 116,741 (H) <5 mIU/mL  ?CBC  ? Collection Time: 03/12/22  2:11 AM  ?Result Value Ref Range  ? WBC 10.6 (H) 4.0 - 10.5 K/uL  ? RBC 3.79 (L) 3.87 - 5.11 MIL/uL  ? Hemoglobin 11.5 (L) 12.0 - 15.0 g/dL  ? HCT 33.9 (L) 36.0 - 46.0 %  ? MCV 89.4 80.0 - 100.0 fL  ? MCH 30.3 26.0 - 34.0 pg  ? MCHC 33.9 30.0 - 36.0 g/dL  ? RDW 12.1 11.5 - 15.5 %  ? Platelets 301 150 - 400 K/uL  ? nRBC 0.0 0.0 - 0.2 %  ?Urinalysis, Routine w reflex microscopic Urine, Clean Catch  ? Collection Time: 03/12/22  2:15 AM  ?Result Value Ref Range  ? Color, Urine YELLOW (A) YELLOW  ? APPearance TURBID (A) CLEAR  ? Specific Gravity, Urine 1.020 1.005 - 1.030  ? pH 7.0 5.0 - 8.0  ? Glucose, UA NEGATIVE  NEGATIVE mg/dL  ? Hgb urine dipstick NEGATIVE NEGATIVE  ? Bilirubin Urine NEGATIVE NEGATIVE  ? Ketones, ur NEGATIVE NEGATIVE mg/dL  ? Protein, ur NEGATIVE NEGATIVE mg/dL  ? Nitrite NEGATIVE NEGATIVE  ? Leukocytes,Ua NEGATIVE NEGATIVE  ? RBC / HPF 6-10 0 - 5 RBC/hpf  ? WBC, UA 0-5 0 - 5 WBC/hpf  ? Bacteria, UA NONE SEEN NONE SEEN  ? Squamous Epithelial / LPF 0-5 0 - 5  ? Mucus PRESENT   ? Amorphous Crystal PRESENT   ?Basic metabolic panel  ? Collection Time: 03/13/22  7:19 AM  ?Result Value Ref Range  ? Sodium 133 (L) 135 - 145 mmol/L  ? Potassium 3.8 3.5 - 5.1 mmol/L  ? Chloride 104 98 - 111 mmol/L  ? CO2 24 22 - 32 mmol/L  ? Glucose, Bld 87 70 - 99 mg/dL  ? BUN 5 (L) 6 - 20 mg/dL  ? Creatinine, Ser 0.47 0.44 - 1.00 mg/dL  ? Calcium 8.5 (L) 8.9 - 10.3 mg/dL  ? GFR, Estimated >60 >60 mL/min  ? Anion gap 5 5 - 15  ?Results for orders placed or performed during the hospital encounter of 03/10/22 (from the past 168 hour(s))  ?CBC with Differential  ? Collection Time: 03/09/22  9:51 PM  ?Result Value Ref Range  ? WBC 11.9 (H) 4.0 - 10.5 K/uL  ? RBC 4.14 3.87 - 5.11 MIL/uL  ? Hemoglobin 12.5 12.0 - 15.0 g/dL  ? HCT 37.0 36.0 - 46.0 %  ? MCV 89.4 80.0 - 100.0 fL  ? MCH 30.2 26.0 -  34.0 pg  ? MCHC 33.8 30.0 - 36.0 g/dL  ? RDW 12.1 11.5 - 15.5 %  ? Platelets 352 150 - 400 K/uL  ? nRBC 0.0 0.0 - 0.2 %  ? Neutrophils Relative % 68 %  ? Neutro Abs 8.1 (H) 1.7 - 7.7 K/uL  ? Lymphocytes Relative 25 %  ? Lymphs Abs 2.9 0.7 - 4.0 K/uL  ? Monocytes Relative 7 %  ? Monocytes Absolute 0.8 0.1 - 1.0 K/uL  ? Eosinophils Relative 0 %  ? Eosinophils Absolute 0.0 0.0 - 0.5 K/uL  ? Basophils Relative 0 %  ? Basophils Absolute 0.0 0.0 - 0.1 K/uL  ? Immature Granulocytes 0 %  ? Abs Immature Granulocytes 0.05 0.00 - 0.07 K/uL  ?Comprehensive metabolic panel  ? Collection Time: 03/09/22  9:51 PM  ?Result Value Ref Range  ? Sodium 137 135 - 145 mmol/L  ? Potassium 3.2 (L) 3.5 - 5.1 mmol/L  ? Chloride 97 (L) 98 - 111 mmol/L  ? CO2 31 22 - 32 mmol/L  ? Glucose, Bld 111 (H) 70 - 99 mg/dL  ? BUN 7 6 - 20 mg/dL  ? Creatinine, Ser 0.55 0.44 - 1.00 mg/dL  ? Calcium 10.5 (H) 8.9 - 10.3 mg/dL  ? Total Protein 7.4 6.5 - 8.1 g/dL  ? Albumin 4.3 3.5 - 5.0 g/dL  ? AST 15 15 - 41 U/L  ? ALT 19 0 - 44 U/L  ? Alkaline Phosphatase 39 38 - 126 U/L  ? Total Bilirubin 0.5 0.3 - 1.2 mg/dL  ? GFR, Estimated >60 >60 mL/min  ? Anion gap 9 5 - 15  ?hCG, quantitative, pregnancy  ? Collection Time: 03/09/22  9:51 PM  ?Result Value Ref Range  ? hCG, Beta Chain, Quant, S 130,490 (H) <5 mIU/mL  ?Lipase, blood  ? Collection Time: 03/09/22  9:51 PM  ?Result Value Ref Range  ? Lipase 28 11 -  51 U/L  ?Magnesium  ? Collection Time: 03/09/22  9:51 PM  ?Result Value Ref Range  ? Magnesium 2.0 1.7 - 2.4 mg/dL  ?Results for orders placed or performed during the hospital encounter of 03/05/22 (from the past 168 hour(s))  ?Basic metabolic panel  ? Collection Time: 03/06/22  1:39 PM  ?Result Value Ref Range  ? Sodium 132 (L) 135 - 145 mmol/L  ? Potassium 3.8 3.5 - 5.1 mmol/L  ? Chloride 105 98 - 111 mmol/L  ? CO2 21 (L) 22 - 32 mmol/L  ? Glucose, Bld 80 70 - 99 mg/dL  ? BUN <5 (L) 6 - 20 mg/dL  ? Creatinine, Ser 0.40 (L) 0.44 - 1.00 mg/dL  ? Calcium 8.2  (L) 8.9 - 10.3 mg/dL  ? GFR, Estimated >60 >60 mL/min  ? Anion gap 6 5 - 15  ?Magnesium  ? Collection Time: 03/06/22  1:39 PM  ?Result Value Ref Range  ? Magnesium 2.3 1.7 - 2.4 mg/dL  ?Basic metabolic panel  ? Collection Time: 03/07/22  8:59 AM  ?Result Value Ref Range  ? Sodium 133 (L) 135 - 145 mmol/L  ? Potassium 3.5 3.5 - 5.1 mmol/L  ? Chloride 106 98 - 111 mmol/L  ? CO2 20 (L) 22 - 32 mmol/L  ? Glucose, Bld 128 (H) 70 - 99 mg/dL  ? BUN <5 (L) 6 - 20 mg/dL  ? Creatinine, Ser 0.45 0.44 - 1.00 mg/dL  ? Calcium 8.8 (L) 8.9 - 10.3 mg/dL  ? GFR, Estimated >60 >60 mL/min  ? Anion gap 7 5 - 15  ? ? ?Treatments: IV hydration and antiemetics  ? ?Hospital Course:  ?This is a 26 y.o. [id number] with IUP at 3865w6d admitted for hyperemesis gravidarum. She was managed with IV hydration and initially IV antiemetics.  Debra Murray was transitioned to PO phenergan and reglan.  By time of discharge she was tolerating regular diet and PO liquids.  She was instructed to continue with medication schedule even if she is feeling better and to notify her prenatal provider if she started to have breakthrough symptoms.  She was deemed stable for discharge to home with outpatient follow up. ? ?Discharge Physical Exam:  ?BP 115/68 (BP Location: Right Arm)   Pulse 88   Temp 98.6 ?F (37 ?C) (Oral)   Resp 20   Ht 6\' 1"  (1.854 m)   Wt 79.3 kg   LMP 01/02/2022   SpO2 98%   BMI 23.07 kg/m?  ? ?General: NAD ?CV: RRR ?Pulm: CTABL, nl effort ?ABD: s/nd/nt, gravid ?DVT Evaluation: LE non-ttp, no evidence of DVT on exam. ? ?Discharge Condition: Stable ? ?Disposition: Discharge disposition: 01-Home or Self Care ? ? ? ? ? ? ?Allergies as of 03/13/2022   ?No Known Allergies ?  ? ?  ?Medication List  ?  ? ?STOP taking these medications   ? ?promethazine 12.5 MG suppository ?Commonly known as: PHENERGAN ?Replaced by: promethazine 25 MG tablet ?  ? ?  ? ?TAKE these medications   ? ?doxylamine (Sleep) 25 MG tablet ?Commonly known as: UNISOM ?Take 1 tablet (25  mg total) by mouth 2 (two) times daily. ?  ?metoCLOPramide 10 MG tablet ?Commonly known as: REGLAN ?Take 1 tablet (10 mg total) by mouth every 6 (six) hours. ?  ?ondansetron 4 MG disintegrating tablet ?Commonly kn

## 2022-03-13 NOTE — Progress Notes (Signed)
ANTEPARTUM PROGRESS NOTE ? ?Debra Murray is a 26 y.o. G3P2002 at [redacted]w[redacted]d who is admitted for Hyperemesis gravidarum ? ?Estimated Date of Delivery: 10/10/22 ? ?Length of Stay:  1 Days. Admitted 03/11/2022 ? ?Subjective: nauseated, continues to have excess saliva. Has been able to tolerate PO meds, feels that phenergan has helped the most so far.  ?She reports: She states she was able to keep down her regular breakfast and lunch without vomiting.  ?-no leakage of fluid ?-no vaginal bleeding ? ? ?Vitals:  BP 115/68 (BP Location: Right Arm)   Pulse 88   Temp 98.6 ?F (37 ?C) (Oral)   Resp 20   Ht 6\' 1"  (1.854 m)   Wt 79.3 kg   LMP 01/02/2022   SpO2 98%   BMI 23.07 kg/m?  ?Physical Examination: ?General:   alert, cooperative, and appears stated age  ?Skin:  normal  ?Neurologic:    Alert & oriented x 3  ?Lungs:   clear to auscultation bilaterally  ?Heart:   regular rate and rhythm, S1, S2 normal, no murmur, click, rub or gallop  ?Abdomen:  soft, non-tender; bowel sounds normal; no masses,  no organomegaly  ?Pelvis:  Exam deferred.  ?Extremities: : non-tender, symmetric, no edema bilaterally.     ? ?S/p [place] 03/12/22 with viable SIUP at [redacted]w[redacted]d ? ? ?Results for orders placed or performed during the hospital encounter of 03/11/22 (from the past 48 hour(s))  ?CBC with Differential     Status: Abnormal  ? Collection Time: 03/11/22  2:27 PM  ?Result Value Ref Range  ? WBC 11.9 (H) 4.0 - 10.5 K/uL  ? RBC 3.97 3.87 - 5.11 MIL/uL  ? Hemoglobin 12.1 12.0 - 15.0 g/dL  ? HCT 35.0 (L) 36.0 - 46.0 %  ? MCV 88.2 80.0 - 100.0 fL  ? MCH 30.5 26.0 - 34.0 pg  ? MCHC 34.6 30.0 - 36.0 g/dL  ? RDW 12.1 11.5 - 15.5 %  ? Platelets 321 150 - 400 K/uL  ? nRBC 0.0 0.0 - 0.2 %  ? Neutrophils Relative % 70 %  ? Neutro Abs 8.3 (H) 1.7 - 7.7 K/uL  ? Lymphocytes Relative 22 %  ? Lymphs Abs 2.6 0.7 - 4.0 K/uL  ? Monocytes Relative 7 %  ? Monocytes Absolute 0.8 0.1 - 1.0 K/uL  ? Eosinophils Relative 1 %  ? Eosinophils Absolute 0.1 0.0 - 0.5 K/uL  ?  Basophils Relative 0 %  ? Basophils Absolute 0.0 0.0 - 0.1 K/uL  ? Immature Granulocytes 0 %  ? Abs Immature Granulocytes 0.05 0.00 - 0.07 K/uL  ?  Comment: Performed at [facility], [address], [place], [place] [address]  ?Comprehensive metabolic panel     Status: Abnormal  ? Collection Time: 03/11/22  2:27 PM  ?Result Value Ref Range  ? Sodium 133 (L) 135 - 145 mmol/L  ? Potassium 3.2 (L) 3.5 - 5.1 mmol/L  ? Chloride 100 98 - 111 mmol/L  ? CO2 24 22 - 32 mmol/L  ? Glucose, Bld 93 70 - 99 mg/dL  ?  Comment: Glucose reference range applies only to samples taken after fasting for at least 8 hours.  ? BUN 6 6 - 20 mg/dL  ? Creatinine, Ser 0.39 (L) 0.44 - 1.00 mg/dL  ? Calcium 8.9 8.9 - 10.3 mg/dL  ? Total Protein 7.0 6.5 - 8.1 g/dL  ? Albumin 4.1 3.5 - 5.0 g/dL  ? AST 20 15 - 41 U/L  ? ALT 32 0 -  44 U/L  ? Alkaline Phosphatase 37 (L) 38 - 126 U/L  ? Total Bilirubin 0.6 0.3 - 1.2 mg/dL  ? GFR, Estimated >60 >60 mL/min  ?  Comment: (NOTE) ?Calculated using the CKD-EPI Creatinine Equation (2021) ?  ? Anion gap 9 5 - 15  ?  Comment: Performed at [facility], [address] [place], [place] [address]  ?hCG, quantitative, pregnancy     Status: Abnormal  ? Collection Time: 03/11/22  2:27 PM  ?Result Value Ref Range  ? hCG, Beta Chain, Quant, S 116,741 (H) <5 mIU/mL  ?  Comment:        ?  GEST. AGE      CONC.  (mIU/mL) ?  <=1 WEEK        5 - 50 ?    2 WEEKS       50 - 500 ?    3 WEEKS       100 - 10,000 ?    4 WEEKS     1,000 - 30,000 ?    5 WEEKS     3,500 - 115,000 ?  6-8 WEEKS     12,000 - 270,000 ?   12 WEEKS     15,000 - 220,000 ?       ?FEMALE AND NON-PREGNANT FEMALE: ?    LESS THAN 5 mIU/mL ?Performed at [facility], [address] [place], [place] [address] ?  ?CBC     Status: Abnormal  ? Collection Time: 03/12/22  2:11 AM  ?Result Value Ref Range  ? WBC 10.6 (H) 4.0 - 10.5 K/uL  ? RBC 3.79 (L) 3.87 - 5.11 MIL/uL  ? Hemoglobin 11.5 (L) 12.0 - 15.0 g/dL  ? HCT 33.9 (L) 36.0 - 46.0 %   ? MCV 89.4 80.0 - 100.0 fL  ? MCH 30.3 26.0 - 34.0 pg  ? MCHC 33.9 30.0 - 36.0 g/dL  ? RDW 12.1 11.5 - 15.5 %  ? Platelets 301 150 - 400 K/uL  ? nRBC 0.0 0.0 - 0.2 %  ?  Comment: Performed at [facility], [address] [place], [place] [address]  ?Urinalysis, Routine w reflex microscopic Urine, Clean Catch     Status: Abnormal  ? Collection Time: 03/12/22  2:15 AM  ?Result Value Ref Range  ? Color, Urine YELLOW (A) YELLOW  ? APPearance TURBID (A) CLEAR  ? Specific Gravity, Urine 1.020 1.005 - 1.030  ? pH 7.0 5.0 - 8.0  ? Glucose, UA NEGATIVE NEGATIVE mg/dL  ? Hgb urine dipstick NEGATIVE NEGATIVE  ? Bilirubin Urine NEGATIVE NEGATIVE  ? Ketones, ur NEGATIVE NEGATIVE mg/dL  ? Protein, ur NEGATIVE NEGATIVE mg/dL  ? Nitrite NEGATIVE NEGATIVE  ? Leukocytes,Ua NEGATIVE NEGATIVE  ? RBC / HPF 6-10 0 - 5 RBC/hpf  ? WBC, UA 0-5 0 - 5 WBC/hpf  ? Bacteria, UA NONE SEEN NONE SEEN  ? Squamous Epithelial / LPF 0-5 0 - 5  ? Mucus PRESENT   ? Amorphous Crystal PRESENT   ?  Comment: Performed at [facility], [address] [place], [place] [address]  ?Basic metabolic panel     Status: Abnormal  ? Collection Time: 03/13/22  7:19 AM  ?Result Value Ref Range  ? Sodium 133 (L) 135 - 145 mmol/L  ? Potassium 3.8 3.5 - 5.1 mmol/L  ? Chloride 104 98 - 111 mmol/L  ? CO2 24 22 - 32 mmol/L  ? Glucose, Bld 87 70 - 99 mg/dL  ?  Comment: Glucose reference range applies only to samples taken after fasting for at least 8 hours.  ? BUN 5 (L) 6 - 20 mg/dL  ? Creatinine, Ser 0.47 0.44 - 1.00 mg/dL  ? Calcium 8.5 (L) 8.9 - 10.3 mg/dL  ? GFR, Estimated >60 >60 mL/min  ?  Comment: (NOTE) ?Calculated using the CKD-EPI Creatinine Equation (2021) ?  ? Anion gap 5 5 - 15  ?  Comment: Performed at [facility], [address] [place], [place] [address]  ? ? ?US OB Comp Less 14 Wks ? ?Result Date: 03/12/2022 ?CLINICAL DATA:  Fetal viability. EXAM: OBSTETRIC <14 WK ULTRASOUND TECHNIQUE: Transabdominal ultrasound was performed  for evaluation of the gestation as well as the maternal uterus and adnexal regions. COMPARISON:  February 27, 2022. FINDINGS: Intrauterine gestational sac: Single Yolk sac:  Visualized. Embryo:  Visualized. Cardiac Activity: Visualized. Heart Rate: 171 bpm CRL: 33.6 mm 10 w 2 d [place] [facility]: October 06, 2022. Subchorionic hemorrhage:  None visualized. Maternal uterus/adnexae: Ovaries are unremarkable. No free fluid is noted. IMPRESSION: Single live intrauterine gestation of 10 weeks 2 days. Electronically Signed   By: Marijo Conception M.D.   On: 03/12/2022 08:50   ? ?Current scheduled medications ? docusate sodium  100 mg Oral BID  ? metoCLOPramide  10 mg Oral Q6H  ? Or  ? metoCLOPramide (REGLAN) injection  10 mg Intravenous Q6H  ? promethazine  12.5-25 mg Oral Q4H  ? Or  ? promethazine  12.5-25 mg Rectal Q4H  ? vitamin B-6  25 mg Oral TID  ? ? ?I have reviewed the patient's current medications. ? ?ASSESSMENT: ?Patient Active Problem List  ? Diagnosis Date Noted  ? Nausea and vomiting 03/06/2022  ? Hyperemesis gravidarum 02/25/2022  ? GI bleeding 02/25/2022  ? Hypokalemia 02/25/2022  ? ? ?PLAN: ?Clinically improving and able to tolerate PO medication  ?PO meds: scheduled phenergan and reglan, discussed importance of scheduled meds even if feeling better at the time. May use zofran, phenergan suppository or compazine for breakthrough.  ?Reviewed importance of slow steady sipping of fluids, small frequent meals, continue to hydrate with electrolyte drinks and water.  ?Stool softeners added, B6 ordered TID.  ?Tentatively plan for DC in this afternoon - this is when her ride is available to pick her up.   ?Rx sent to pharmacy for phenergan tablets, suppositories, PO reglan and Vitamin B6.  ?Follow up appointment scheduled with [facility] ob/gyn on 03/22/2022.  She was instructed to notify her prenatal provider if she is having breakthrough symptoms so that they can adjust her medications.   ? ? ?Minda Meo, CNM  ?Certified Nurse  Midwife ?[facility] ?[facility]  ?
